# Patient Record
Sex: Female | Born: 1975
Health system: Southern US, Community
[De-identification: ages and names within clinical notes are randomized; demographics above are authoritative.]

## PROBLEM LIST (undated history)

## (undated) DIAGNOSIS — F419 Anxiety disorder, unspecified: Secondary | ICD-10-CM

## (undated) DIAGNOSIS — G35 Multiple sclerosis: Secondary | ICD-10-CM

## (undated) DIAGNOSIS — H269 Unspecified cataract: Secondary | ICD-10-CM

## (undated) HISTORY — DX: Unspecified cataract: H26.9

## (undated) HISTORY — PX: ABDOMINAL HYSTERECTOMY: SHX81

## (undated) HISTORY — DX: Multiple sclerosis: G35

## (undated) HISTORY — PX: CHOLECYSTECTOMY: SHX55

## (undated) HISTORY — PX: EYE SURGERY: SHX253

## (undated) HISTORY — DX: Anxiety disorder, unspecified: F41.9

---

## 2012-01-20 ENCOUNTER — Ambulatory Visit (INDEPENDENT_AMBULATORY_CARE_PROVIDER_SITE_OTHER): Payer: BC Managed Care – PPO

## 2012-01-20 ENCOUNTER — Encounter: Payer: Self-pay | Admitting: Sports Medicine

## 2012-01-20 ENCOUNTER — Ambulatory Visit (INDEPENDENT_AMBULATORY_CARE_PROVIDER_SITE_OTHER): Payer: BC Managed Care – PPO | Admitting: Sports Medicine

## 2012-01-20 VITALS — BP 114/74 | HR 72 | Wt 173.0 lb

## 2012-01-20 DIAGNOSIS — M47816 Spondylosis without myelopathy or radiculopathy, lumbar region: Secondary | ICD-10-CM | POA: Insufficient documentation

## 2012-01-20 DIAGNOSIS — M5416 Radiculopathy, lumbar region: Secondary | ICD-10-CM

## 2012-01-20 DIAGNOSIS — M545 Low back pain, unspecified: Secondary | ICD-10-CM

## 2012-01-20 DIAGNOSIS — N3946 Mixed incontinence: Secondary | ICD-10-CM | POA: Insufficient documentation

## 2012-01-20 DIAGNOSIS — IMO0002 Reserved for concepts with insufficient information to code with codable children: Secondary | ICD-10-CM

## 2012-01-20 MED ORDER — PREDNISONE 50 MG PO TABS: ORAL_TABLET | ORAL | Status: DC

## 2012-01-20 MED ORDER — MELOXICAM 15 MG PO TABS: ORAL_TABLET | ORAL | Status: DC

## 2012-01-20 NOTE — Progress Notes (Signed)
Subjective:    CC: Establish care.   HPI:  Low back pain: Present for several weeks, worse with flexion, associated with pain running down left buttock, left lateral thigh, lateral lower leg, and lateral foot causing numbness and tingling in the toes. Does have a history of multiple sclerosis, but also has a strong family history of disc protrusions. Denies any new bowel or bladder incontinence. No saddle anesthesia. No constitutional symptoms.   Incontinence: Present for many years, was present before her hysterectomy which was done for endometrial dysplasia. Occurs with jumps, Valsalva, coughing, sneezing. Denies any sudden urges to use the bathroom, and denies any imperceptible dribbling. Was placed on oxybutynin by her neurologist, but has  noted no benefit.  She did have frequent UTIs earlier on with her multiple sclerosis, but has been a longtime since this happened.  No dysuria, flank pain, burning, or frequency today.  Multiple sclerosis: Followed by her neurologist, is under good control.  Past medical history, Surgical history, Family history, Social history, Allergies, and medications have been entered into the medical record, reviewed, and no changes needed.   Review of Systems: No headache, visual changes, nausea, vomiting, diarrhea, constipation, dizziness, abdominal pain, skin rash, fevers, chills, night sweats, swollen lymph nodes, weight loss, chest pain, body aches, joint swelling, muscle aches, or shortness of breath.   Objective:    General: Well Developed, well nourished, and in no acute distress.  Neuro: Alert and oriented x3, extra-ocular muscles intact.  HEENT: Normocephalic, atraumatic, pupils equal round reactive to light, neck supple, no masses, no lymphadenopathy, thyroid nonpalpable.  Skin: Warm and dry, no rashes noted.  Cardiac: Regular rate and rhythm, no murmurs rubs or gallops.  Respiratory: Clear to auscultation bilaterally. Not using accessory muscles,  speaking in full sentences.  Abdominal: Soft, nontender, nondistended, positive bowel sounds, no masses, no organomegaly.  Back Exam:  Inspection: Unremarkable  Motion: Flexion 45 deg, Extension 45 deg, Side Bending to 45 deg bilaterally,  Rotation to 45 deg bilaterally  SLR laying: Negative  XSLR laying: Negative  Palpable tenderness: None. FABER: negative. Sensory change: Gross sensation intact to all lumbar and sacral dermatomes.  Reflexes: 2+ at both patellar tendons, 2+ at achilles tendons, Babinski's downgoing.  Strength at foot  Plantar-flexion: 5/5 Dorsi-flexion: 5/5 Eversion: 5/5 Inversion: 5/5  Leg strength  Quad: 5/5 Hamstring: 5/5 Hip flexor: 5/5 Hip abductors: 5/5  Gait unremarkable.     Impression and Recommendations:  I spent one hour with this patient, 50% was face-to-face counseling.

## 2012-01-20 NOTE — Patient Instructions (Addendum)
Hip Rehabilitation Protocol:  1.  Side leg raises.  3x30 with no weight, then 3x15 with 2 lb ankle weight, then 3x15 with 5 lb ankle weight 

## 2012-01-20 NOTE — Assessment & Plan Note (Signed)
Suspect left-sided S1, discogenic. Home rehabilitation, prednisone, Mobic, x-rays, return to clinic 4 weeks to reassess. MRI if no better for interventional injection planning

## 2012-01-20 NOTE — Assessment & Plan Note (Addendum)
Suspect predominantly stress based on symptoms. I do not think that oxybutynin is good for her symptoms, I have asked her to stop this. Would like her to work on [employer],  I would also like her to get plugged into urology for urodynamic studies, and assistance with treatment.

## 2012-01-21 ENCOUNTER — Telehealth: Payer: Self-pay | Admitting: *Deleted

## 2012-01-21 NOTE — Telephone Encounter (Signed)
Pt called asking about her xray results. I have called her to let her know that they were negative/normal.

## 2012-07-07 ENCOUNTER — Encounter: Payer: Self-pay | Admitting: Sports Medicine

## 2012-07-24 ENCOUNTER — Encounter: Payer: Self-pay | Admitting: Sports Medicine

## 2012-10-09 ENCOUNTER — Encounter: Payer: Self-pay | Admitting: Sports Medicine

## 2012-10-09 ENCOUNTER — Ambulatory Visit (INDEPENDENT_AMBULATORY_CARE_PROVIDER_SITE_OTHER): Payer: BC Managed Care – PPO | Admitting: Sports Medicine

## 2012-10-09 ENCOUNTER — Ambulatory Visit (INDEPENDENT_AMBULATORY_CARE_PROVIDER_SITE_OTHER): Payer: BC Managed Care – PPO

## 2012-10-09 VITALS — BP 106/65 | HR 71 | Wt 182.0 lb

## 2012-10-09 DIAGNOSIS — M76899 Other specified enthesopathies of unspecified lower limb, excluding foot: Secondary | ICD-10-CM

## 2012-10-09 DIAGNOSIS — M5412 Radiculopathy, cervical region: Secondary | ICD-10-CM | POA: Insufficient documentation

## 2012-10-09 DIAGNOSIS — N3946 Mixed incontinence: Secondary | ICD-10-CM

## 2012-10-09 DIAGNOSIS — G35 Multiple sclerosis: Secondary | ICD-10-CM

## 2012-10-09 DIAGNOSIS — G35D Multiple sclerosis, unspecified: Secondary | ICD-10-CM

## 2012-10-09 DIAGNOSIS — M5030 Other cervical disc degeneration, unspecified cervical region: Secondary | ICD-10-CM

## 2012-10-09 DIAGNOSIS — M5416 Radiculopathy, lumbar region: Secondary | ICD-10-CM

## 2012-10-09 DIAGNOSIS — M7062 Trochanteric bursitis, left hip: Secondary | ICD-10-CM | POA: Insufficient documentation

## 2012-10-09 DIAGNOSIS — M7061 Trochanteric bursitis, right hip: Secondary | ICD-10-CM | POA: Insufficient documentation

## 2012-10-09 IMAGING — CR DG CERVICAL SPINE COMPLETE 4+V
5 series · 5 of 5 positions shown · non-contrast
Comparison: None.

CLINICAL DATA: Left C7 radiculopathy.

CERVICAL SPINE - COMPLETE 4+ VIEW

[view not recorded (1 of 5)]
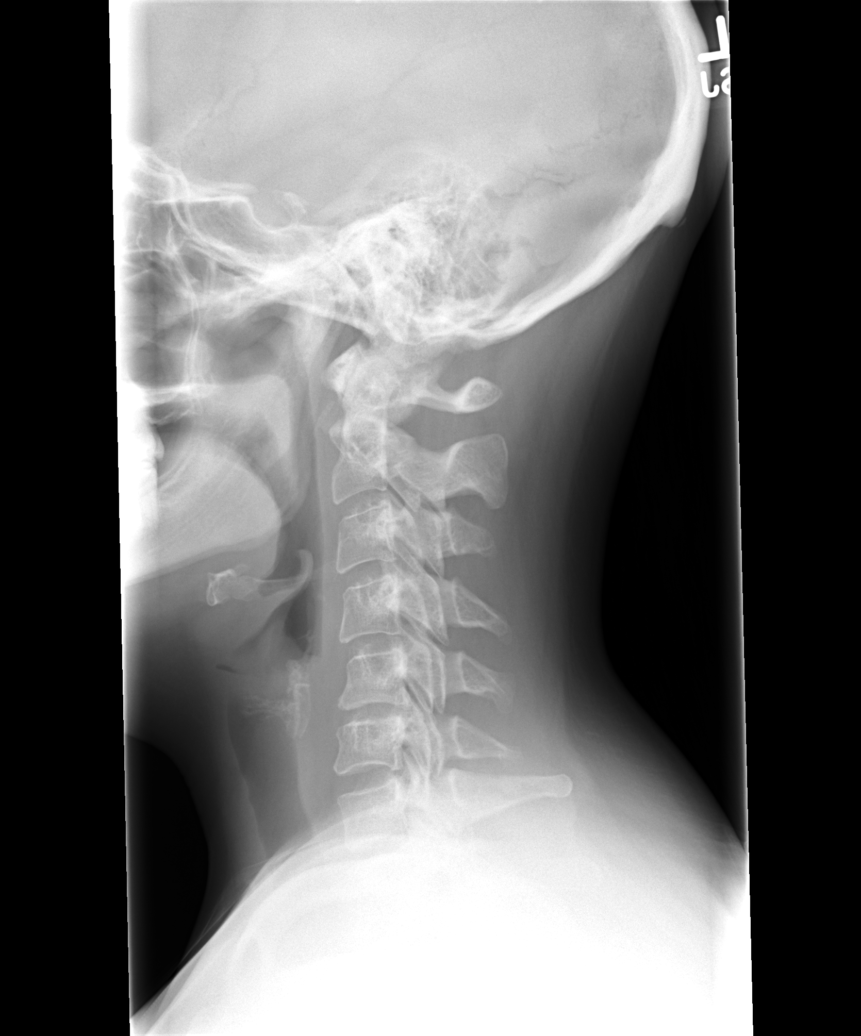

[view not recorded (2 of 5)]
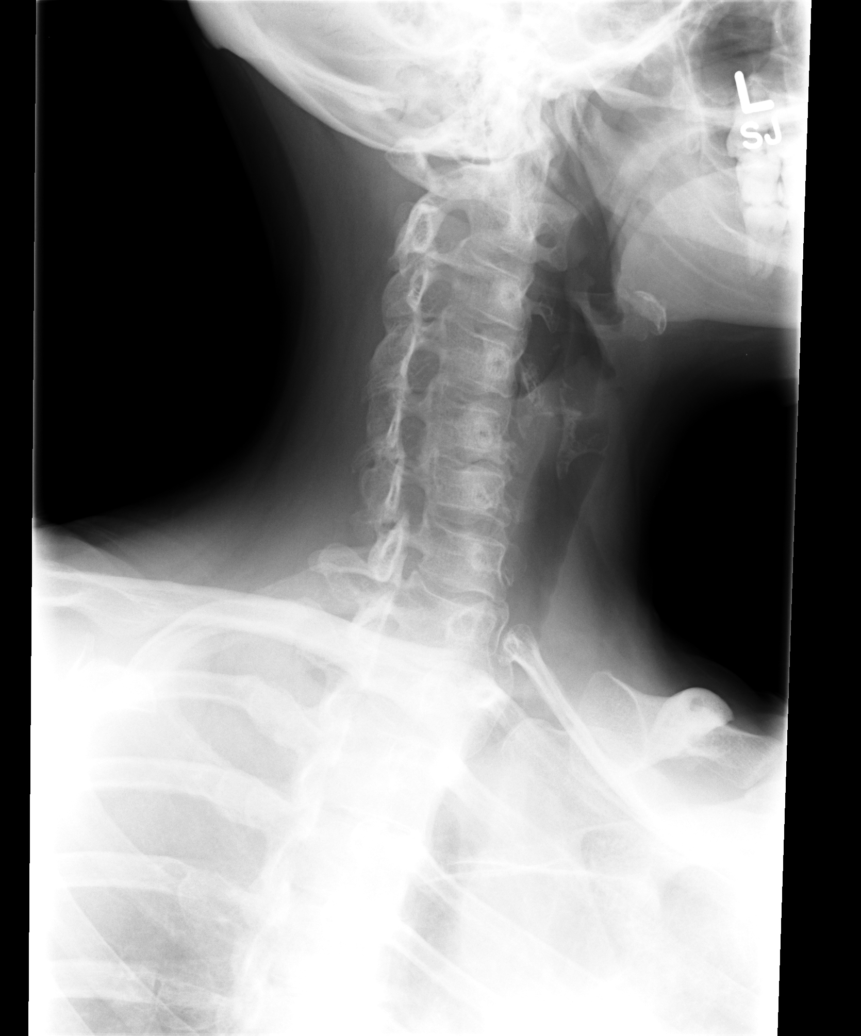

[view not recorded (3 of 5)]
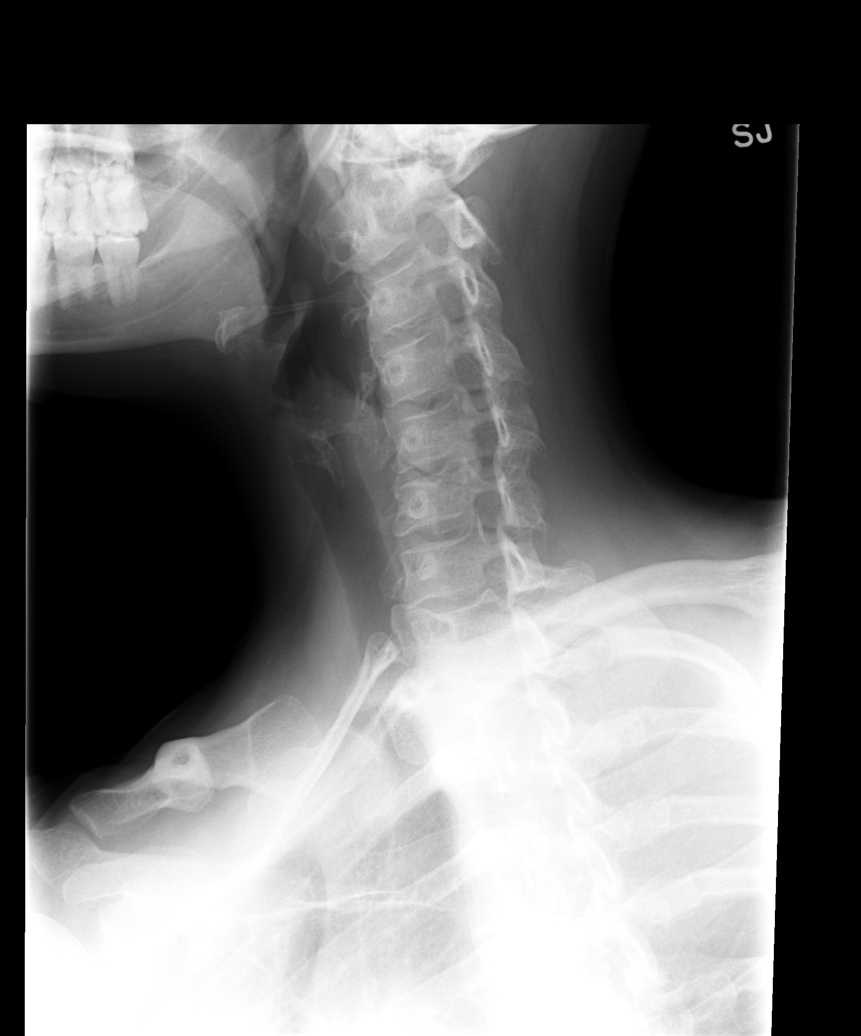

[view not recorded (4 of 5)]
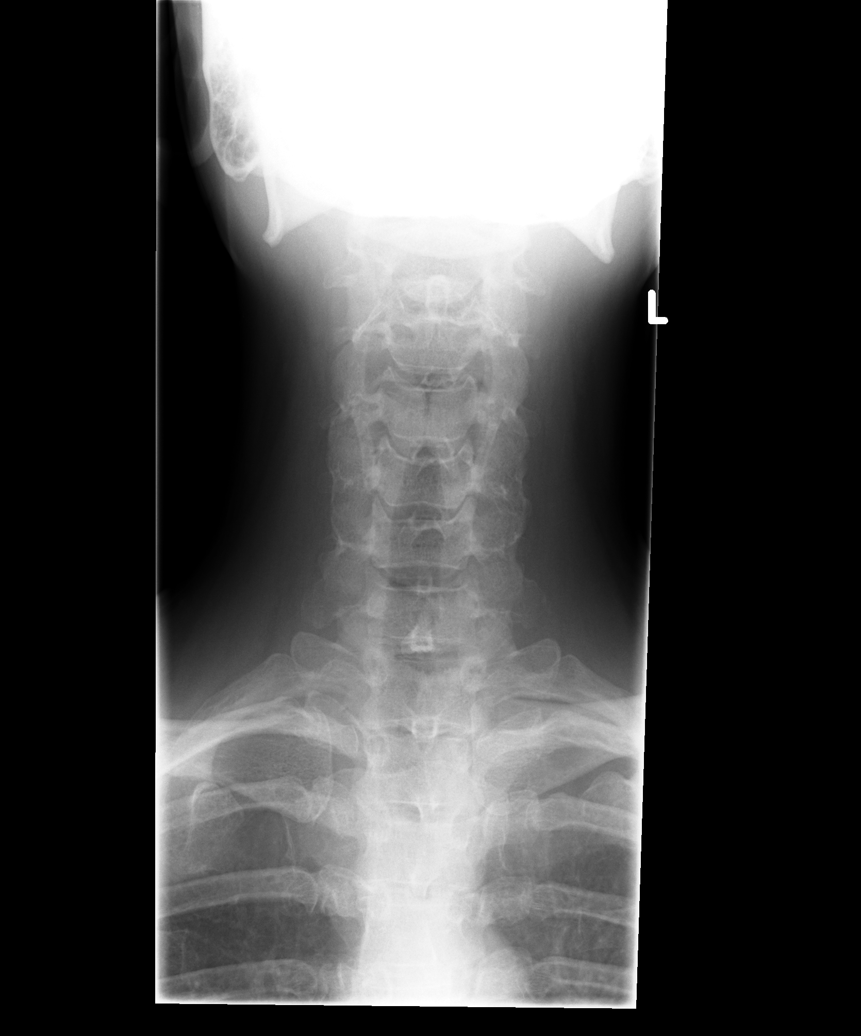

[view not recorded (5 of 5)]
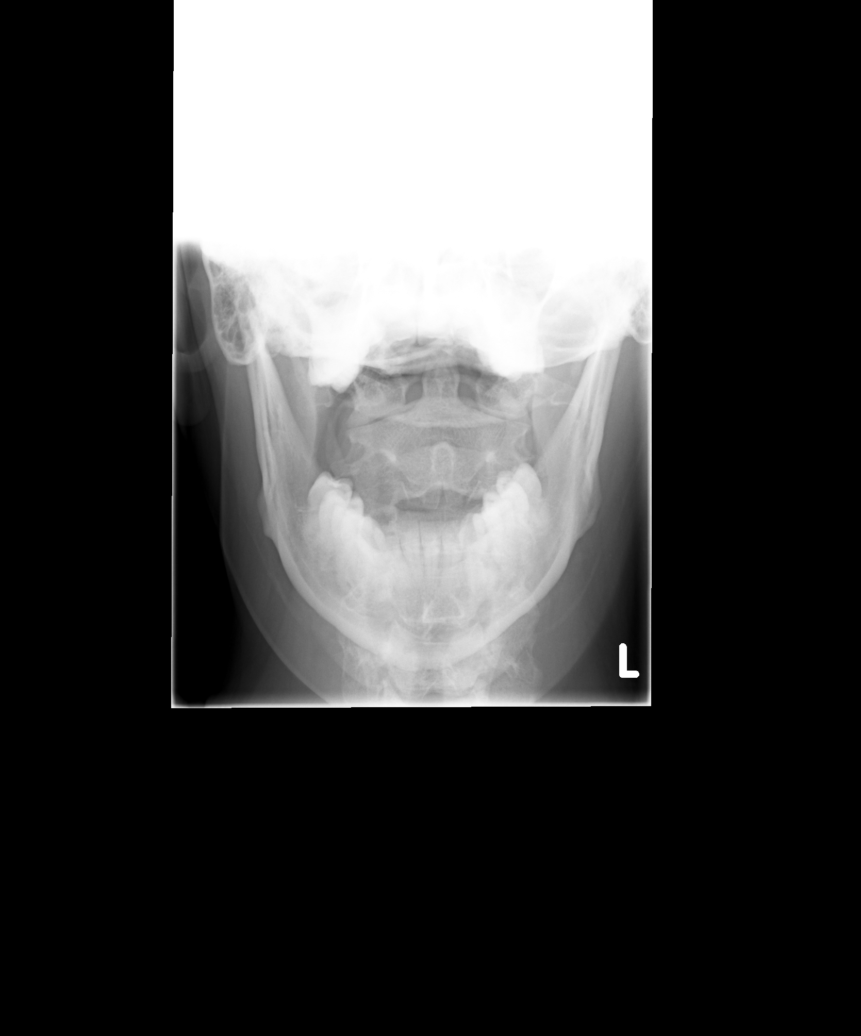

[5 of 5 positions shown; findings below may reference images not displayed]

FINDINGS: The lateral film demonstrates mild straightening of the
normal cervical lordosis.  The alignment is normal.  Disc spaces
are fairly well maintained.  Minimal degenerative disc disease
noted at C5-6.  The facets are normally aligned.  The neural
foramen are patent.  Small cervical ribs are noted.  The C1-2
articulations are maintained.  The lung apices are clear.
IMPRESSION: 1.  Mild straightening of the normal cervical lordosis.
2.  Minimal degenerative disc disease at C5-6.
3.  Normal alignment and no acute bony findings.

## 2012-10-09 MED ORDER — BACLOFEN 10 MG PO TABS: 10.0000 mg | ORAL_TABLET | Freq: Two times a day (BID) | ORAL | Status: DC

## 2012-10-09 MED ORDER — MELOXICAM 15 MG PO TABS: ORAL_TABLET | ORAL | Status: DC

## 2012-10-09 NOTE — Assessment & Plan Note (Signed)
Mixed stress and urge incontinence. Continue baclofen.

## 2012-10-09 NOTE — Assessment & Plan Note (Signed)
Resolved with conservative measures. 

## 2012-10-09 NOTE — Assessment & Plan Note (Signed)
Continue conservative measures, injection if no better in 4 weeks.

## 2012-10-09 NOTE — Progress Notes (Signed)
  Subjective:    CC: Followup  HPI: Lumbar radiculitis: Resolved with conservative measures.  Neck pain: Radiates to the left upper arm, and down in the C7 distribution. Worse with looking to the left, pain is moderate, persistent.  Bilateral hip pain: Pain is localized over both trochanteric bursae, doesn't radiate, moderate, unable to lie on her sides due to pain. She does continue to do hip abductor exercises.  Past medical history, Surgical history, Family history not pertinant except as noted below, Social history, Allergies, and medications have been entered into the medical record, reviewed, and no changes needed.   Review of Systems: No fevers, chills, night sweats, weight loss, chest pain, or shortness of breath.   Objective:    General: Well Developed, well nourished, and in no acute distress.  Neuro: Alert and oriented x3, extra-ocular muscles intact, sensation grossly intact.  HEENT: Normocephalic, atraumatic, pupils equal round reactive to light, neck supple, no masses, no lymphadenopathy, thyroid nonpalpable.  Skin: Warm and dry, no rashes. Cardiac: Regular rate and rhythm, no murmurs rubs or gallops, no lower extremity edema.  Respiratory: Clear to auscultation bilaterally. Not using accessory muscles, speaking in full sentences. Neck: Inspection unremarkable. No palpable stepoffs. Negative Spurling's maneuver. Full neck range of motion Grip strength and sensation normal in bilateral hands Strength good C4 to T1 distribution No sensory change to C4 to T1 Negative Hoffman sign bilaterally Reflexes normal Bilateral Hip: ROM IR: 45 Deg, ER: 45 Deg, Flexion: 120 Deg, Extension: 100 Deg, Abduction: 45 Deg, Adduction: 45 Deg Strength IR: 5/5, ER: 5/5, Flexion: 5/5, Extension: 5/5, Abduction: 5/5, Adduction: 5/5 Pelvic alignment unremarkable to inspection and palpation. Standing hip rotation and gait without trendelenburg sign / unsteadiness. Greater trochanter with  tenderness to palpation. No tenderness over piriformis and greater trochanter. No pain with FABER or FADIR. No SI joint tenderness and normal minimal SI movement. Hip abductor strength is much better.  X-rays reviewed and show bilateral C5-C6 degenerative disc disease that is mild.  Impression and Recommendations:

## 2012-10-09 NOTE — Assessment & Plan Note (Signed)
Seems to be left C7. Home exercises, Mobic. Baclofen. X-rays. Return in 4 weeks.

## 2012-11-23 ENCOUNTER — Encounter: Payer: Self-pay | Admitting: Sports Medicine

## 2012-11-23 ENCOUNTER — Ambulatory Visit (INDEPENDENT_AMBULATORY_CARE_PROVIDER_SITE_OTHER): Payer: BC Managed Care – PPO | Admitting: Sports Medicine

## 2012-11-23 VITALS — BP 98/66 | HR 79 | Wt 184.0 lb

## 2012-11-23 DIAGNOSIS — M5412 Radiculopathy, cervical region: Secondary | ICD-10-CM

## 2012-11-23 NOTE — Progress Notes (Signed)
  Subjective:    CC: Followup  HPI: Left cervical radiculitis: Julie Ibarra returns, she had pain in her neck radiating down her left arm in the C7 distribution. X-rays at that time showed C5-C6 degenerative disc disease that was mild. I placed her through home exercises, baclofen, NSAIDs. She could not use steroids. She returns today with pain approximately the same. It is worse when turning her head to the left, radiates down the left arm in the C7 distribution, is mild to moderate.  Past medical history, Surgical history, Family history not pertinant except as noted below, Social history, Allergies, and medications have been entered into the medical record, reviewed, and no changes needed.   Review of Systems: No fevers, chills, night sweats, weight loss, chest pain, or shortness of breath.   Objective:    General: Well Developed, well nourished, and in no acute distress.  Neuro: Alert and oriented x3, extra-ocular muscles intact, sensation grossly intact.  HEENT: Normocephalic, atraumatic, pupils equal round reactive to light, neck supple, no masses, no lymphadenopathy, thyroid nonpalpable.  Skin: Warm and dry, no rashes. Cardiac: Regular rate and rhythm, no murmurs rubs or gallops, no lower extremity edema.  Respiratory: Clear to auscultation bilaterally. Not using accessory muscles, speaking in full sentences. Neck: Inspection unremarkable. No palpable stepoffs. Negative Spurling's maneuver. Full neck range of motion Grip strength and sensation normal in bilateral hands Strength good C4 to T1 distribution No sensory change to C4 to T1 Negative Hoffman sign bilaterally Reflexes normal  Impression and Recommendations:

## 2012-11-23 NOTE — Assessment & Plan Note (Signed)
Persistent left-sided C7 radiculopathy despite home exercises and NSAIDs, she is declining steroids. At this point we are going to now proceed with formal physical therapy, she does have an x-ray showing C5-C6 degenerative disc disease. 6 with some formal physical therapy, she'll come back to see me and we can proceed with MRI for interventional injection planning if no better.

## 2013-03-19 ENCOUNTER — Encounter: Payer: Self-pay | Admitting: Sports Medicine

## 2013-03-19 ENCOUNTER — Ambulatory Visit (INDEPENDENT_AMBULATORY_CARE_PROVIDER_SITE_OTHER): Payer: BC Managed Care – PPO | Admitting: Sports Medicine

## 2013-03-19 VITALS — BP 102/66 | HR 81 | Ht 68.0 in | Wt 186.0 lb

## 2013-03-19 DIAGNOSIS — M5412 Radiculopathy, cervical region: Secondary | ICD-10-CM

## 2013-03-19 DIAGNOSIS — E663 Overweight: Secondary | ICD-10-CM

## 2013-03-19 LAB — CBC
HCT: 38.6 % (ref 36.0–46.0)
Hemoglobin: 13.6 g/dL (ref 12.0–15.0)
MCH: 32.4 pg (ref 26.0–34.0)
MCHC: 35.2 g/dL (ref 30.0–36.0)
MCV: 91.9 fL (ref 78.0–100.0)
Platelets: 252 K/uL (ref 150–400)
RBC: 4.2 MIL/uL (ref 3.87–5.11)
RDW: 13.4 % (ref 11.5–15.5)
WBC: 4.6 K/uL (ref 4.0–10.5)

## 2013-03-19 LAB — COMPREHENSIVE METABOLIC PANEL
BUN: 17 mg/dL (ref 6–23)
CO2: 27 mEq/L (ref 19–32)
Glucose, Bld: 84 mg/dL (ref 70–99)
Sodium: 142 mEq/L (ref 135–145)
Total Bilirubin: 0.5 mg/dL (ref 0.3–1.2)
Total Protein: 6.5 g/dL (ref 6.0–8.3)

## 2013-03-19 LAB — LIPID PANEL
Cholesterol: 135 mg/dL (ref 0–200)
HDL: 59 mg/dL (ref >39)
LDL Cholesterol: 63 mg/dL (ref 0–99)
Total CHOL/HDL Ratio: 2.3 ratio
Triglycerides: 66 mg/dL (ref <150)
VLDL: 13 mg/dL (ref 0–40)

## 2013-03-19 LAB — COMPREHENSIVE METABOLIC PANEL WITH GFR
ALT: 22 U/L (ref 0–35)
AST: 20 U/L (ref 0–37)
Albumin: 4.4 g/dL (ref 3.5–5.2)
Alkaline Phosphatase: 66 U/L (ref 39–117)
Calcium: 9.7 mg/dL (ref 8.4–10.5)
Chloride: 104 meq/L (ref 96–112)
Creat: 0.7 mg/dL (ref 0.50–1.10)
Potassium: 4.8 meq/L (ref 3.5–5.3)

## 2013-03-19 LAB — HEMOGLOBIN A1C
Hgb A1c MFr Bld: 5.1 % (ref <5.7)
Mean Plasma Glucose: 100 mg/dL (ref <117)

## 2013-03-19 LAB — TSH: TSH: 1.926 u[IU]/mL (ref 0.350–4.500)

## 2013-03-19 MED ORDER — PHENTERMINE HCL 37.5 MG PO CAPS: 37.5000 mg | ORAL_CAPSULE | ORAL | Status: DC

## 2013-03-19 NOTE — Progress Notes (Signed)
  Subjective:    CC: Weight loss  HPI: Julie Ibarra is a very pleasant 37 year old female, she is overweight, and she does have a history of lumbar and cervical degenerative disc disease as well as multiple sclerosis. She has tried multiple means of dieting, but is unable to lose weight. She does exercise, but at approximately 50% of her max heart rate for only about 20 minutes. She desires further advice and possibly pharmacologic intervention for weight loss.  Past medical history, Surgical history, Family history not pertinant except as noted below, Social history, Allergies, and medications have been entered into the medical record, reviewed, and no changes needed.   Review of Systems: No fevers, chills, night sweats, weight loss, chest pain, or shortness of breath.   Objective:    General: Well Developed, well nourished, and in no acute distress.  Neuro: Alert and oriented x3, extra-ocular muscles intact, sensation grossly intact.  HEENT: Normocephalic, atraumatic, pupils equal round reactive to light, neck supple, no masses, no lymphadenopathy, thyroid nonpalpable.  Skin: Warm and dry, no rashes. Cardiac: Regular rate and rhythm, no murmurs rubs or gallops, no lower extremity edema.  Respiratory: Clear to auscultation bilaterally. Not using accessory muscles, speaking in full sentences.  Impression and Recommendations:   I spent 25 minutes with this patient, greater than 50% was face-to-face time counseling regarding the above diagnosis.

## 2013-03-19 NOTE — Patient Instructions (Signed)
Exercise prescription:  You should adjust the intensity of your exercise based on your heart rate. The [employer] sports medicine recommends keeping your heart rate between 70-80% of its maximum for 30 minutes, 3-5 times per week. Maximum heart rate = (220 - age). Multiply this number by 0.75 to get your goal heart rate. If lower, then increase the intensity of your exercise. If the number is higher, you may decrease the intensity of your exercise. Your goal heart rate is 145.  Also check glycemic indices at www.[facility].com

## 2013-03-19 NOTE — Assessment & Plan Note (Signed)
Discussed dieting, exercise prescription, we are going to start phentermine. We can use overweight with complications of degenerative disc disease, but she may have to still pay for this out of pocket.

## 2013-03-19 NOTE — Assessment & Plan Note (Signed)
Significantly improved with physical therapy. Return as needed for this.

## 2013-08-07 ENCOUNTER — Other Ambulatory Visit: Payer: Self-pay | Admitting: Sports Medicine

## 2013-12-03 ENCOUNTER — Emergency Department
Admission: EM | Admit: 2013-12-03 | Discharge: 2013-12-03 | Disposition: A | Discharge status: Home / Self Care | Payer: BC Managed Care – PPO | Source: Home / Self Care | Attending: Family Medicine | Admitting: Family Medicine

## 2013-12-03 ENCOUNTER — Encounter: Payer: Self-pay | Admitting: Emergency Medicine

## 2013-12-03 DIAGNOSIS — N3000 Acute cystitis without hematuria: Secondary | ICD-10-CM

## 2013-12-03 LAB — POCT URINALYSIS DIP (MANUAL ENTRY)
BILIRUBIN UA: NEGATIVE
Bilirubin, UA: NEGATIVE
Blood, UA: NEGATIVE
GLUCOSE UA: NEGATIVE
Nitrite, UA: NEGATIVE
Protein Ur, POC: NEGATIVE
Spec Grav, UA: 1.01 (ref 1.005–1.03)
Urobilinogen, UA: 0.2 (ref 0–1)
pH, UA: 7 (ref 5–8)

## 2013-12-03 MED ORDER — NITROFURANTOIN MONOHYD MACRO 100 MG PO CAPS: 100.0000 mg | ORAL_CAPSULE | Freq: Two times a day (BID) | ORAL | Status: DC

## 2013-12-03 NOTE — ED Provider Notes (Signed)
CSN: [id number]     Arrival date & time 12/03/13  4098 History   First MD Initiated Contact with Patient 12/03/13 1006     Chief Complaint  Patient presents with  . Dysuria  . Urinary Frequency      HPI Comments: Patient complains of two day history of urgency, dysuria, occasional incontinence, and hesitancy.  Patient has had a hysterectomy.   Patient is a 38 y.o. female presenting with dysuria. The history is provided by the patient.  Dysuria Pain quality:  Burning Pain severity:  Mild Onset quality:  Gradual Duration:  2 days Timing:  Constant Progression:  Worsening Chronicity:  New Recent urinary tract infections: no   Relieved by:  Phenazopyridine Worsened by:  Nothing tried Ineffective treatments:  None tried Urinary symptoms: frequent urination, hesitancy and incontinence   Urinary symptoms: no discolored urine, no foul-smelling urine and no hematuria   Associated symptoms: no abdominal pain, no fever, no flank pain, no genital lesions, no nausea, no vaginal discharge and no vomiting   Risk factors: recurrent urinary tract infections     Past Medical History  Diagnosis Date  . Multiple sclerosis    Past Surgical History  Procedure Laterality Date  . Cholecystectomy    . Abdominal hysterectomy     Family History  Problem Relation Age of Onset  . Alcohol abuse Mother   . Hypertension Mother   . COPD Mother   . Alcohol abuse Father   . Heart disease Father   . Hyperlipidemia Father   . Hypertension Father    History  Substance Use Topics  . Smoking status: Former Games developer  . Smokeless tobacco: Not on file  . Alcohol Use: Yes   OB History   Grav Para Term Preterm Abortions TAB SAB Ect Mult Living                 Review of Systems  Constitutional: Negative for fever.  Gastrointestinal: Negative for nausea, vomiting and abdominal pain.  Genitourinary: Positive for dysuria. Negative for flank pain and vaginal discharge.  All other systems reviewed and are  negative.   Allergies  Review of patient's allergies indicates no known allergies.  Home Medications   Prior to Admission medications   Medication Sig Start Date End Date Taking? Authorizing Provider  baclofen (LIORESAL) 10 MG tablet Take 1 tablet (10 mg total) by mouth 2 (two) times daily. 10/09/12   Monica Becton, MD  escitalopram (LEXAPRO) 10 MG tablet Take 10 mg by mouth daily.    Historical Provider, MD  Fingolimod HCl 0.5 MG CAPS Take 0.5 mg by mouth daily.    Historical Provider, MD  meloxicam (MOBIC) 15 MG tablet TAKE ONE TABLET BY MOUTH EVERY MORNING WITH BREAKFAST FOR 2 WEEKS. THEN TAKE ONCE DAILY AS NEEDED FOR PAIN.    Monica Becton, MD  nitrofurantoin, macrocrystal-monohydrate, (MACROBID) 100 MG capsule Take 1 capsule (100 mg total) by mouth 2 (two) times daily. Take with food. 12/03/13   Lattie Haw, MD  phentermine 37.5 MG capsule Take 1 capsule (37.5 mg total) by mouth every morning. 03/19/13   Monica Becton, MD   BP 104/53  Pulse 76  Temp(Src) 98.6 F (37 C) (Oral)  Resp 16  Ht  (1.727 m)  Wt 190 lb (86.183 kg)  BMI 28.90 kg/m2  SpO2 96% Physical Exam Nursing notes and Vital Signs reviewed. Appearance:  Patient appears healthy, stated age, and in no acute distress Eyes:  Pupils are equal,  round, and reactive to light and accomodation.  Extraocular movement is intact.  Conjunctivae are not inflamed .  Pharynx:  Normal; moist mucous membranes  Neck:  Supple.  No adenopathy Lungs:  Clear to auscultation.  Breath sounds are equal.  Heart:  Regular rate and rhythm without murmurs, rubs, or gallops.  Abdomen:  Nontender without masses or hepatosplenomegaly.  Bowel sounds are present.  No CVA or flank tenderness.  Extremities:  No edema.  No calf tenderness Skin:  No rash present.   ED Course  Procedures  None    Labs Reviewed  URINE CULTURE  POCT URINALYSIS DIP (MANUAL ENTRY):  LEU small, otherwise negative      MDM   1. Acute  cystitis without hematuria    Urine culture pending. Begin Macrobid. Continue increased fluid intake.  May take AZO for two more days. Followup with Family Doctor if not improved in one week.     Lattie Haw, MD 12/03/13 1120

## 2013-12-03 NOTE — ED Notes (Signed)
Reports dysuria and frequency x 2 days; has history of UTIs. Used AZO last night.

## 2013-12-03 NOTE — Discharge Instructions (Signed)
Continue increased fluid intake.  May take AZO for two more days.   Urinary Tract Infection Urinary tract infections (UTIs) can develop anywhere along your urinary tract. Your urinary tract is your body's drainage system for removing wastes and extra water. Your urinary tract includes two kidneys, two ureters, a bladder, and a urethra. Your kidneys are a pair of bean-shaped organs. Each kidney is about the size of your fist. They are located below your ribs, one on each side of your spine. CAUSES Infections are caused by microbes, which are microscopic organisms, including fungi, viruses, and bacteria. These organisms are so small that they can only be seen through a microscope. Bacteria are the microbes that most commonly cause UTIs. SYMPTOMS  Symptoms of UTIs may vary by age and gender of the patient and by the location of the infection. Symptoms in young women typically include a frequent and intense urge to urinate and a painful, burning feeling in the bladder or urethra during urination. Older women and men are more likely to be tired, shaky, and weak and have muscle aches and abdominal pain. A fever may mean the infection is in your kidneys. Other symptoms of a kidney infection include pain in your back or sides below the ribs, nausea, and vomiting. DIAGNOSIS To diagnose a UTI, your caregiver will ask you about your symptoms. Your caregiver also will ask to provide a urine sample. The urine sample will be tested for bacteria and white blood cells. White blood cells are made by your body to help fight infection. TREATMENT  Typically, UTIs can be treated with medication. Because most UTIs are caused by a bacterial infection, they usually can be treated with the use of antibiotics. The choice of antibiotic and length of treatment depend on your symptoms and the type of bacteria causing your infection. HOME CARE INSTRUCTIONS  If you were prescribed antibiotics, take them exactly as your caregiver  instructs you. Finish the medication even if you feel better after you have only taken some of the medication.  Drink enough water and fluids to keep your urine clear or pale yellow.  Avoid caffeine, tea, and carbonated beverages. They tend to irritate your bladder.  Empty your bladder often. Avoid holding urine for long periods of time.  Empty your bladder before and after sexual intercourse.  After a bowel movement, women should cleanse from front to back. Use each tissue only once. SEEK MEDICAL CARE IF:   You have back pain.  You develop a fever.  Your symptoms do not begin to resolve within 3 days. SEEK IMMEDIATE MEDICAL CARE IF:   You have severe back pain or lower abdominal pain.  You develop chills.  You have nausea or vomiting.  You have continued burning or discomfort with urination. MAKE SURE YOU:   Understand these instructions.  Will watch your condition.  Will get help right away if you are not doing well or get worse. Document Released: 12/23/2004 Document Revised: 09/14/2011 Document Reviewed: 04/23/2011 [facility] Patient Information 2015 [place], [place]. This information is not intended to replace advice given to you by your health care provider. Make sure you discuss any questions you have with your health care provider.

## 2013-12-07 ENCOUNTER — Telehealth: Payer: Self-pay | Admitting: Emergency Medicine

## 2013-12-07 LAB — URINE CULTURE

## 2013-12-07 NOTE — ED Notes (Signed)
Inquired about patient's status; encourage them to call with questions/concerns.  

## 2014-03-06 ENCOUNTER — Telehealth: Payer: Self-pay

## 2014-03-06 NOTE — Telephone Encounter (Signed)
[facility] Please advise that patient last OV was 03/19/2013. Rhonda Cunningham,CMA

## 2014-03-07 MED ORDER — BACLOFEN 10 MG PO TABS: 10.0000 mg | ORAL_TABLET | Freq: Two times a day (BID) | ORAL | Status: DC

## 2014-03-07 NOTE — Telephone Encounter (Signed)
Patient informed. Rhonda Cunningham,CMA  

## 2014-03-07 NOTE — Telephone Encounter (Signed)
refilled 

## 2014-03-25 ENCOUNTER — Ambulatory Visit (INDEPENDENT_AMBULATORY_CARE_PROVIDER_SITE_OTHER): Payer: BC Managed Care – PPO | Admitting: Sports Medicine

## 2014-03-25 ENCOUNTER — Encounter: Payer: Self-pay | Admitting: Sports Medicine

## 2014-03-25 VITALS — BP 101/66 | HR 65 | Ht 68.0 in | Wt 186.0 lb

## 2014-03-25 DIAGNOSIS — N3946 Mixed incontinence: Secondary | ICD-10-CM

## 2014-03-25 DIAGNOSIS — M2400 Loose body in unspecified joint: Secondary | ICD-10-CM

## 2014-03-25 DIAGNOSIS — L821 Other seborrheic keratosis: Secondary | ICD-10-CM

## 2014-03-25 MED ORDER — BACLOFEN 10 MG PO TABS: 10.0000 mg | ORAL_TABLET | Freq: Two times a day (BID) | ORAL | Status: DC

## 2014-03-25 NOTE — Assessment & Plan Note (Signed)
Approximately 6 mm across. It does appear to be sitting in the superficial infrapatellar bursa, and medially but outside the joint. This is not symptomatic, we can ignore this.

## 2014-03-25 NOTE — Assessment & Plan Note (Signed)
Benign, return as needed. Patient declines cryotherapy.

## 2014-03-25 NOTE — Progress Notes (Signed)
  Subjective:    CC: Couple of complaints  HPI: Spots on face: Right-sided, there is a single lesion that has been present for some time now, it is asymptomatic.  Mass in knee: Patient has reported a very small mass under the skin in her anterior knee that is movable but not painful. She tells me her entire knee is overall asymptomatic.  Past medical history, Surgical history, Family history not pertinant except as noted below, Social history, Allergies, and medications have been entered into the medical record, reviewed, and no changes needed.   Review of Systems: No fevers, chills, night sweats, weight loss, chest pain, or shortness of breath.   Objective:    General: Well Developed, well nourished, and in no acute distress.  Neuro: Alert and oriented x3, extra-ocular muscles intact, sensation grossly intact.  HEENT: Normocephalic, atraumatic, pupils equal round reactive to light, neck supple, no masses, no lymphadenopathy, thyroid nonpalpable.  Skin: Warm and dry, no rashes. Seborrheic keratosis on the right scalp. Cardiac: Regular rate and rhythm, no murmurs rubs or gallops, no lower extremity edema.  Respiratory: Clear to auscultation bilaterally. Not using accessory muscles, speaking in full sentences. Left Knee: Normal to inspection with no erythema or effusion or obvious bony abnormalities. There is a subcentimeter palpable, well-defined, movable mass below the skin that can be moved both over the distal patellar tendon and medial to it. Palpation normal with no warmth or joint line tenderness or patellar tenderness or condyle tenderness. ROM normal in flexion and extension and lower leg rotation. Ligaments with solid consistent endpoints including ACL, PCL, LCL, MCL. Negative Mcmurray's and provocative meniscal tests. Non painful patellar compression. Patellar and quadriceps tendons unremarkable. Hamstring and quadriceps strength is normal.  Procedure: Diagnostic Ultrasound  of left knee  Device: GE Logiq E  Findings: 6 mm hypoechoic structure that is movable. Images permanently stored and available for review in the ultrasound unit.  Impression:  Cyst versus joint mouse, extra-articular  Impression and Recommendations:

## 2014-03-25 NOTE — Assessment & Plan Note (Signed)
Refilling baclofen

## 2014-03-25 NOTE — Patient Instructions (Signed)
Seborrheic Keratosis Seborrheic keratosis is a common, noncancerous (benign) skin growth that can occur anywhere on the skin.It looks like "stuck-on," waxy, rough, tan, brown, or black spots on the skin. These skin growths can be flat or raised.They are often called "barnacles" because of their pasted-on appearance.Usually, these skin growths appear in adulthood, around age 40, and increase in number as you age. They may also develop during pregnancy or following estrogen therapy. Many people may only have one growth appear in their lifetime, while some people may develop many growths. CAUSES It is unknown what causes these skin growths, but they appear to run in families. SYMPTOMS Seborrheic keratosis is often located on the face, chest, shoulders, back, or other areas. These growths are:  Usually painless, but may become irritated and itchy.  Yellow, brown, black, or other colors.  Slightly raised or have a flat surface.  Sometimes rough or wart-like in texture.  Often waxy on the surface.  Round or oval-shaped.  Sometimes "stuck-on" in appearance.  Sometimes single, but there are usually many growths. Any growth that bleeds, itches on a regular basis, becomes inflamed, or becomes irritated needs to be evaluated by a skin specialist (dermatologist). DIAGNOSIS Diagnosis is mainly based on the way the growths appear. In some cases, it can be difficult to tell this type of skin growth from skin cancer. A skin growth tissue sample (biopsy) may be used to confirm the diagnosis. TREATMENT Most often, treatment is not needed because the skin growths are benign.If the skin growth is irritated easily by clothing or jewelry, causing it to scab or bleed, treatment may be recommended. Patients may also choose to have the growths removed because they do not like their appearance. Most commonly, these growths are treated with cryosurgery. In cryosurgery, liquid nitrogen is applied to "freeze" the  growth. The growth usually falls off within a matter of days. A blister may form and dry into a scab that will also fall off. After the growth or scab falls off, it may leave a dark or light spot on the skin. This color may fade over time, or it may remain permanent on the skin. HOME CARE INSTRUCTIONS If the skin growths are treated with cryosurgery, the treated area needs to be kept clean with water and soap. SEEK MEDICAL CARE IF:  You have questions about these growths or other skin problems.  You develop new symptoms, including:  A change in the appearance of the skin growth.  New growths.  Any bleeding, itching, or pain in the growths.  A skin growth that looks similar to seborrheic keratosis. Document Released: 04/17/2010 Document Revised: 06/07/2011 Document Reviewed: 04/17/2010 [employer] Patient Information 2015 [employer]. This information is not intended to replace advice given to you by your health care provider. Make sure you discuss any questions you have with your health care provider.  

## 2014-06-03 ENCOUNTER — Ambulatory Visit (INDEPENDENT_AMBULATORY_CARE_PROVIDER_SITE_OTHER): Payer: BLUE CROSS/BLUE SHIELD | Admitting: Physician Assistant

## 2014-06-03 ENCOUNTER — Encounter: Payer: Self-pay | Admitting: Physician Assistant

## 2014-06-03 VITALS — BP 119/81 | HR 80 | Ht 68.0 in | Wt 189.0 lb

## 2014-06-03 DIAGNOSIS — R300 Dysuria: Secondary | ICD-10-CM | POA: Diagnosis not present

## 2014-06-03 DIAGNOSIS — N3000 Acute cystitis without hematuria: Secondary | ICD-10-CM | POA: Diagnosis not present

## 2014-06-03 LAB — POCT URINALYSIS DIPSTICK
BILIRUBIN UA: NEGATIVE
Glucose, UA: NEGATIVE
Ketones, UA: NEGATIVE
Nitrite, UA: NEGATIVE
PH UA: 6
Protein, UA: NEGATIVE
RBC UA: NEGATIVE
Spec Grav, UA: <=1.005
Urobilinogen, UA: 0.2

## 2014-06-03 MED ORDER — CIPROFLOXACIN HCL 500 MG PO TABS: 500.0000 mg | ORAL_TABLET | Freq: Two times a day (BID) | ORAL | Status: DC

## 2014-06-03 NOTE — Patient Instructions (Signed)

## 2014-06-03 NOTE — Progress Notes (Signed)
   Subjective:    Patient ID: Julie Ibarra, female    DOB: [birth year], 39 y.o.   MRN: [id number]  HPI Pt presents to the clinic with painful urination that started this am. No fever, chills, nausea, flank or abdominal pain. Hx of UTI's. She has MS and on medication with potential side effect. Not tried anything to make better.    Review of Systems  All other systems reviewed and are negative.      Objective:   Physical Exam  Constitutional: She is oriented to person, place, and time. She appears well-developed and well-nourished.  HENT:  Head: Normocephalic and atraumatic.  Cardiovascular: Normal rate, regular rhythm and normal heart sounds.   Pulmonary/Chest: Effort normal and breath sounds normal.  No CVA tenderness.   Abdominal: Soft. Bowel sounds are normal. She exhibits no distension and no mass. There is no tenderness. There is no rebound and no guarding.  Neurological: She is alert and oriented to person, place, and time.  Skin: Skin is dry.  Psychiatric: She has a normal mood and affect. Her behavior is normal.          Assessment & Plan:  Acute cysitis without hematuria- .. Results for orders placed or performed in visit on 06/03/14  POCT urinalysis dipstick  Result Value Ref Range   Color, UA yellow    Clarity, UA slightly cloudy    Glucose, UA neg    Bilirubin, UA neg    Ketones, UA neg    Spec Grav, UA <=1.005    Blood, UA neg    pH, UA 6.0    Protein, UA neg    Urobilinogen, UA 0.2    Nitrite, UA neg    Leukocytes, UA moderate (2+)    will treat with cipro for 3 days. Pt has tolerated well in past. Will culture. Stay hydrated. HO given. Follow up if not improving or as needed.

## 2014-06-04 LAB — URINE CULTURE
Colony Count: NO GROWTH
Organism ID, Bacteria: NO GROWTH

## 2014-06-11 ENCOUNTER — Ambulatory Visit (INDEPENDENT_AMBULATORY_CARE_PROVIDER_SITE_OTHER): Payer: BLUE CROSS/BLUE SHIELD | Admitting: Sports Medicine

## 2014-06-11 ENCOUNTER — Encounter: Payer: Self-pay | Admitting: Sports Medicine

## 2014-06-11 VITALS — BP 111/72 | HR 75 | Wt 187.0 lb

## 2014-06-11 DIAGNOSIS — IMO0001 Reserved for inherently not codable concepts without codable children: Secondary | ICD-10-CM

## 2014-06-11 DIAGNOSIS — M5416 Radiculopathy, lumbar region: Secondary | ICD-10-CM

## 2014-06-11 DIAGNOSIS — M609 Myositis, unspecified: Secondary | ICD-10-CM

## 2014-06-11 DIAGNOSIS — M545 Low back pain, unspecified: Secondary | ICD-10-CM | POA: Insufficient documentation

## 2014-06-11 DIAGNOSIS — M791 Myalgia: Secondary | ICD-10-CM

## 2014-06-11 DIAGNOSIS — L821 Other seborrheic keratosis: Secondary | ICD-10-CM

## 2014-06-11 DIAGNOSIS — N3090 Cystitis, unspecified without hematuria: Secondary | ICD-10-CM

## 2014-06-11 LAB — URINALYSIS
Bilirubin Urine: NEGATIVE
Glucose, UA: NEGATIVE mg/dL
Hgb urine dipstick: NEGATIVE
Ketones, ur: NEGATIVE mg/dL
Leukocytes, UA: NEGATIVE
Nitrite: NEGATIVE
Protein, ur: NEGATIVE mg/dL
Specific Gravity, Urine: 1.009 (ref 1.005–1.030)
Urobilinogen, UA: 0.2 mg/dL (ref 0.0–1.0)
pH: 6.5 (ref 5.0–8.0)

## 2014-06-11 NOTE — Progress Notes (Signed)
  Subjective:    CC: Low back pain  HPI: For the past several weeks Julie Ibarra has had left-sided low back pain without radiculopathy, worse with standing, extension, rotation, not worse with flexion, Valsalva. It is very localized, moderate, persistent.  UTI: Symptoms have resolved, desiring test of cure.  Past medical history, Surgical history, Family history not pertinant except as noted below, Social history, Allergies, and medications have been entered into the medical record, reviewed, and no changes needed.   Review of Systems: No fevers, chills, night sweats, weight loss, chest pain, or shortness of breath.   Objective:    General: Well Developed, well nourished, and in no acute distress.  Neuro: Alert and oriented x3, extra-ocular muscles intact, sensation grossly intact.  HEENT: Normocephalic, atraumatic, pupils equal round reactive to light, neck supple, no masses, no lymphadenopathy, thyroid nonpalpable.  Skin: Warm and dry, no rashes. Cardiac: Regular rate and rhythm, no murmurs rubs or gallops, no lower extremity edema.  Respiratory: Clear to auscultation bilaterally. Not using accessory muscles, speaking in full sentences. Back Exam:  Inspection: Unremarkable  Motion: Flexion 45 deg, Extension 45 deg, Side Bending to 45 deg bilaterally,  Rotation to 45 deg bilaterally  SLR laying: Negative  XSLR laying: Negative  Palpable tenderness: Left paralumbar muscles. FABER: negative. Sensory change: Gross sensation intact to all lumbar and sacral dermatomes.  Reflexes: 2+ at both patellar tendons, 2+ at achilles tendons, Babinski's downgoing.  Strength at foot  Plantar-flexion: 5/5 Dorsi-flexion: 5/5 Eversion: 5/5 Inversion: 5/5  Leg strength  Quad: 5/5 Hamstring: 5/5 Hip flexor: 5/5 Hip abductors: 5/5  Gait unremarkable.  Procedure:  Injection of left paralumbar trigger point Consent obtained and verified. Time-out conducted. Noted no overlying erythema, induration, or  other signs of local infection. Skin prepped in a sterile fashion. Topical analgesic spray: Ethyl chloride. Completed without difficulty. Meds: 1 mL kenalog 40, 1 cc lidocaine injected in a fanlike pattern into the affected musculature. Pain immediately improved suggesting accurate placement of the medication. Advised to call if fevers/chills, erythema, induration, drainage, or persistent bleeding.   Impression and Recommendations:

## 2014-06-11 NOTE — Assessment & Plan Note (Signed)
Having some muscle spasm in the left paralumbar muscles, there is minimal pain over the SI joint with a negative Patrick's test. We are going to do a simple trigger point injection, and she will work harder with lumbar spine rehabilitation exercises, return to see me in 4 weeks, MRI if no better.

## 2014-06-11 NOTE — Assessment & Plan Note (Signed)
Cryotherapy 1 We can look at this again when she returns in a month.

## 2014-07-10 ENCOUNTER — Ambulatory Visit (INDEPENDENT_AMBULATORY_CARE_PROVIDER_SITE_OTHER): Payer: BLUE CROSS/BLUE SHIELD | Admitting: Sports Medicine

## 2014-07-10 ENCOUNTER — Encounter: Payer: Self-pay | Admitting: Sports Medicine

## 2014-07-10 VITALS — BP 126/74 | HR 75 | Ht 68.0 in | Wt 184.0 lb

## 2014-07-10 DIAGNOSIS — L821 Other seborrheic keratosis: Secondary | ICD-10-CM | POA: Diagnosis not present

## 2014-07-10 DIAGNOSIS — M5416 Radiculopathy, lumbar region: Secondary | ICD-10-CM

## 2014-07-10 NOTE — Progress Notes (Signed)
  Subjective:    CC: Followup  HPI: Lumbar radiculopathy: resolved with trigger point injections and rehab exercises at the last visit.  Seborrheic keratosis:  Nearly resolved on right face with cryotherapy at the last visit.  Past medical history, Surgical history, Family history not pertinant except as noted below, Social history, Allergies, and medications have been entered into the medical record, reviewed, and no changes needed.   Review of Systems: No fevers, chills, night sweats, weight loss, chest pain, or shortness of breath.   Objective:    General: Well Developed, well nourished, and in no acute distress.  Neuro: Alert and oriented x3, extra-ocular muscles intact, sensation grossly intact.  HEENT: Normocephalic, atraumatic, pupils equal round reactive to light, neck supple, no masses, no lymphadenopathy, thyroid nonpalpable.  Skin: Warm and dry, no rashes. Cardiac: Regular rate and rhythm, no murmurs rubs or gallops, no lower extremity edema.  Respiratory: Clear to auscultation bilaterally. Not using accessory muscles, speaking in full sentences.  Procedure:  Cryodestruction of several (8) cutaneous seborrheic keratoses and moles on face. Consent obtained and verified. Time-out conducted. Noted no overlying erythema, induration, or other signs of local infection. Completed without difficulty using Cryo-Gun. Advised to call if fevers/chills, erythema, induration, drainage, or persistent bleeding.  Impression and Recommendations:

## 2014-07-10 NOTE — Assessment & Plan Note (Signed)
Response to cryotherapy on the right side of the face. We did this again, and I also did cryotherapy on several other seborrheic keratoses, moles on the left side of her face and neck, 8 in total. Return in one month for a repeat, we took pictures before the procedure.

## 2014-07-10 NOTE — Assessment & Plan Note (Signed)
Essentially pain-free, rehabilitation exercises given. She will continue these daily.

## 2014-08-07 ENCOUNTER — Ambulatory Visit: Payer: BLUE CROSS/BLUE SHIELD | Admitting: Sports Medicine

## 2014-08-16 ENCOUNTER — Ambulatory Visit (INDEPENDENT_AMBULATORY_CARE_PROVIDER_SITE_OTHER): Payer: BLUE CROSS/BLUE SHIELD | Admitting: Sports Medicine

## 2014-08-16 ENCOUNTER — Encounter: Payer: Self-pay | Admitting: Sports Medicine

## 2014-08-16 VITALS — BP 107/66 | HR 84 | Ht 68.0 in | Wt 183.0 lb

## 2014-08-16 DIAGNOSIS — L821 Other seborrheic keratosis: Secondary | ICD-10-CM

## 2014-08-16 NOTE — Progress Notes (Signed)
  Subjective:    CC: Follow-up  HPI: Skin lesions: The right-sided seborrheic keratosis has almost completely disappeared, the left-sided skin lesions have improved significantly and the patient does desire repeat cryotherapy. Symptoms are mild, improving.  Past medical history, Surgical history, Family history not pertinant except as noted below, Social history, Allergies, and medications have been entered into the medical record, reviewed, and no changes needed.   Review of Systems: No fevers, chills, night sweats, weight loss, chest pain, or shortness of breath.   Objective:    General: Well Developed, well nourished, and in no acute distress.  Neuro: Alert and oriented x3, extra-ocular muscles intact, sensation grossly intact.  HEENT: Normocephalic, atraumatic, pupils equal round reactive to light, neck supple, no masses, no lymphadenopathy, thyroid nonpalpable.  Skin: Warm and dry, no rashes. Cardiac: Regular rate and rhythm, no murmurs rubs or gallops, no lower extremity edema.  Respiratory: Clear to auscultation bilaterally. Not using accessory muscles, speaking in full sentences.  Procedure:  Cryodestruction of several (7) cutaneous seborrheic keratoses and moles on face. Consent obtained and verified. Time-out conducted. Noted no overlying erythema, induration, or other signs of local infection. Completed without difficulty using Cryo-Gun. Advised to call if fevers/chills, erythema, induration, drainage, or persistent bleeding.  Impression and Recommendations:

## 2014-08-16 NOTE — Assessment & Plan Note (Signed)
Excellent response to the first cryotherapy with near complete flattening of all skin lesions on the left side of the cheek, I performed a second cryotherapy on approximately 7 seborrheic keratoses and moles. The seborrheic keratosis on the right side of her face is imperceptible now post 2 sessions of cryotherapy. She can return to see me on an as-needed basis. Again we took pictures before this procedure to compare to prior.

## 2014-09-16 ENCOUNTER — Ambulatory Visit: Payer: BLUE CROSS/BLUE SHIELD | Admitting: Sports Medicine

## 2015-04-09 ENCOUNTER — Other Ambulatory Visit: Payer: Self-pay | Admitting: Sports Medicine

## 2015-09-23 ENCOUNTER — Ambulatory Visit (INDEPENDENT_AMBULATORY_CARE_PROVIDER_SITE_OTHER): Payer: BLUE CROSS/BLUE SHIELD | Admitting: Sports Medicine

## 2015-09-23 ENCOUNTER — Encounter: Payer: Self-pay | Admitting: Sports Medicine

## 2015-09-23 VITALS — BP 94/67 | HR 92 | Resp 18 | Wt 182.8 lb

## 2015-09-23 DIAGNOSIS — J069 Acute upper respiratory infection, unspecified: Secondary | ICD-10-CM

## 2015-09-23 LAB — POCT RAPID STREP A (OFFICE): Rapid Strep A Screen: NEGATIVE

## 2015-09-23 NOTE — Patient Instructions (Signed)
Upper Respiratory Infection, Adult Most upper respiratory infections (URIs) are a viral infection of the air passages leading to the lungs. A URI affects the nose, throat, and upper air passages. The most common type of URI is nasopharyngitis and is typically referred to as "the common cold." URIs run their course and usually go away on their own. Most of the time, a URI does not require medical attention, but sometimes a bacterial infection in the upper airways can follow a viral infection. This is called a secondary infection. Sinus and middle ear infections are common types of secondary upper respiratory infections. Bacterial pneumonia can also complicate a URI. A URI can worsen asthma and chronic obstructive pulmonary disease (COPD). Sometimes, these complications can require emergency medical care and may be life threatening.  CAUSES Almost all URIs are caused by viruses. A virus is a type of germ and can spread from one person to another.  RISKS FACTORS You may be at risk for a URI if:   You smoke.   You have chronic heart or lung disease.  You have a weakened defense (immune) system.   You are very young or very old.   You have nasal allergies or asthma.  You work in crowded or poorly ventilated areas.  You work in health care facilities or schools. SIGNS AND SYMPTOMS  Symptoms typically develop 2-3 days after you come in contact with a cold virus. Most viral URIs last 7-10 days. However, viral URIs from the influenza virus (flu virus) can last 14-18 days and are typically more severe. Symptoms may include:   Runny or stuffy (congested) nose.   Sneezing.   Cough.   Sore throat.   Headache.   Fatigue.   Fever.   Loss of appetite.   Pain in your forehead, behind your eyes, and over your cheekbones (sinus pain).  Muscle aches.  DIAGNOSIS  Your health care provider may diagnose a URI by:  Physical exam.  Tests to check that your symptoms are not due to  another condition such as:  Strep throat.  Sinusitis.  Pneumonia.  Asthma. TREATMENT  A URI goes away on its own with time. It cannot be cured with medicines, but medicines may be prescribed or recommended to relieve symptoms. Medicines may help:  Reduce your fever.  Reduce your cough.  Relieve nasal congestion. HOME CARE INSTRUCTIONS   Take medicines only as directed by your health care provider.   Gargle warm saltwater or take cough drops to comfort your throat as directed by your health care provider.  Use a warm mist humidifier or inhale steam from a shower to increase air moisture. This may make it easier to breathe.  Drink enough fluid to keep your urine clear or pale yellow.   Eat soups and other clear broths and maintain good nutrition.   Rest as needed.   Return to work when your temperature has returned to normal or as your health care provider advises. You may need to stay home longer to avoid infecting others. You can also use a face mask and careful hand washing to prevent spread of the virus.  Increase the usage of your inhaler if you have asthma.   Do not use any tobacco products, including cigarettes, chewing tobacco, or electronic cigarettes. If you need help quitting, ask your health care provider. PREVENTION  The best way to protect yourself from getting a cold is to practice good hygiene.   Avoid oral or hand contact with people with cold   symptoms.   Wash your hands often if contact occurs.  There is no clear evidence that vitamin C, vitamin E, echinacea, or exercise reduces the chance of developing a cold. However, it is always recommended to get plenty of rest, exercise, and practice good nutrition.  SEEK MEDICAL CARE IF:   You are getting worse rather than better.   Your symptoms are not controlled by medicine.   You have chills.  You have worsening shortness of breath.  You have brown or red mucus.  You have yellow or brown nasal  discharge.  You have pain in your face, especially when you bend forward.  You have a fever.  You have swollen neck glands.  You have pain while swallowing.  You have white areas in the back of your throat. SEEK IMMEDIATE MEDICAL CARE IF:   You have severe or persistent:  Headache.  Ear pain.  Sinus pain.  Chest pain.  You have chronic lung disease and any of the following:  Wheezing.  Prolonged cough.  Coughing up blood.  A change in your usual mucus.  You have a stiff neck.  You have changes in your:  Vision.  Hearing.  Thinking.  Mood. MAKE SURE YOU:   Understand these instructions.  Will watch your condition.  Will get help right away if you are not doing well or get worse.   This information is not intended to replace advice given to you by your health care provider. Make sure you discuss any questions you have with your health care provider.   Document Released: 09/08/2000 Document Revised: 07/30/2014 Document Reviewed: 06/20/2013 [employer] Interactive Patient Education 2016 [employer]  

## 2015-09-23 NOTE — Progress Notes (Signed)
  Subjective:    CC: Feeling sick  HPI: This is a pleasant 40 year old female, for 1 week she's had sore throat, mild cough, malaise. No rash, no sick contacts. Symptoms are moderate, persistent.  Past medical history, Surgical history, Family history not pertinant except as noted below, Social history, Allergies, and medications have been entered into the medical record, reviewed, and no changes needed.   Review of Systems: No fevers, chills, night sweats, weight loss, chest pain, or shortness of breath.   Objective:    General: Well Developed, well nourished, and in no acute distress.  Neuro: Alert and oriented x3, extra-ocular muscles intact, sensation grossly intact.  HEENT: Normocephalic, atraumatic, pupils equal round reactive to light, neck supple, no masses, no lymphadenopathy, thyroid nonpalpable. Oropharynx, nasopharynx, ear canals unremarkable. Skin: Warm and dry, no rashes. Cardiac: Regular rate and rhythm, no murmurs rubs or gallops, no lower extremity edema.  Respiratory: Clear to auscultation bilaterally. Not using accessory muscles, speaking in full sentences.  Rapid strep test is negative  Impression and Recommendations:   I spent 25 minutes with this patient, greater than 50% was face-to-face time counseling regarding the above diagnoses

## 2015-09-23 NOTE — Assessment & Plan Note (Signed)
Benign exam. Negative strep test. Vimovo samples given for pain relief. Return as needed

## 2015-09-25 ENCOUNTER — Ambulatory Visit (INDEPENDENT_AMBULATORY_CARE_PROVIDER_SITE_OTHER): Payer: BLUE CROSS/BLUE SHIELD | Admitting: Osteopathic Medicine

## 2015-09-25 ENCOUNTER — Encounter: Payer: Self-pay | Admitting: Osteopathic Medicine

## 2015-09-25 VITALS — BP 101/67 | HR 99 | Temp 98.3°F | Ht 68.0 in | Wt 185.0 lb

## 2015-09-25 DIAGNOSIS — H6981 Other specified disorders of Eustachian tube, right ear: Secondary | ICD-10-CM | POA: Diagnosis not present

## 2015-09-25 MED ORDER — IPRATROPIUM BROMIDE 0.03 % NA SOLN: 2.0000 | Freq: Three times a day (TID) | NASAL | Status: DC

## 2015-09-25 MED ORDER — AMOXICILLIN-POT CLAVULANATE 875-125 MG PO TABS: 1.0000 | ORAL_TABLET | Freq: Two times a day (BID) | ORAL | Status: DC

## 2015-09-25 NOTE — Patient Instructions (Signed)
DR. Junaid Wurzer'S HOME CARE INSTRUCTIONS: VIRAL ILLNESSES - ACHES/PAINS, SORE THROAT, SINUSITIS, COUGH, GASTRITIS   FRIST, A FEW NOTES ON OVER-THE-COUNTER MEDICATIONS!   USE CAUTION - MANY OVER-THE-COUNTER MEDICATIONS COME IN COMBINATIONS OF MULTIPLE GENERIC MEDICINES. FOR INSTANCE, NYQUIL HAS TYLENOL + COUGH MEDICINE + AN ANTIHISTAMINE, SO BE CAREFUL YOU'RE NOT TAKING A COMBINATION MEDICINE WHICH CONTAINS MEDICATIONS YOU'RE ALSO TAKING SEPARATELY (LIKE NYQUIL SYRUP PLUS TYLENOL PILLS).   YOUR PHARMACIST CAN HELP YOU AVOID MEDICATION INTERACTIONS AND DUPLICATIONS - ASK FOR THEIR HELP IF YOU ARE CONFUSED OR UNSURE ABOUT WHAT TO PURCHASE OVER-THE-COUNTER!   REMEMBER - IF YOU'RE EVER CONCERNED ABOUT MEDICATION SIDE EFFECTS, OR IF YOU'RE EVER CONCERNED YOUR SYMPTOMS ARE GETTING WORSE DESPITE TREATMENT, PLEASE CALL THE DOCTOR'S OFFICE! IF AFTER-HOURS, YOU CAN BE SEEN IN URGENT CARE OR, FOR SEVERE ILLNESS, PLEASE GO TO THE EMERGENCY ROOM.   TREAT SINUS CONGESTION, RUNNY NOSE & POSTNASAL DRIP:  Treat to increase airflow through sinuses, decrease congestion pain and prevent bacterial growth!   Remember, only 0.5-2% of sinus infections are due to a bacteria, the rest are due to a virus (usually the common cold) which will not get better with antibiotics!  NASAL SPRAYS: generally safe and should not interact with other medicines. Can take any of these medications, either alone or together... FLONASE (FLUTICASONE) - 2 sprays in each nostril twice per day (also a great allergy medicine to use long-term!) AFRIN (OXYMETOLAZONE) - use sparingly because it will cause rebound congestion, NEVER USE IN KIDS  SALINE NASAL SPRAY- no limit, but avoid use after other nasal sprays or it can wash the medicine away PRESCRTIPTION ATROVENT - as directed on prescription bottle  ANTIHISTAMINES: Helps dry out runny nose and decreases postnasal drip. Benadryl may cause drowsiness but other preparations should not be  as sedating. Certain kinds are not as safe in elderly individuals. OK to use unless Dr A says otherwise.  Only use one of the following... BENADRYL (DIPHENHYDRAMINE) - 25-50 mg every 6 hours ZYRTEC (CETIRIZINE) - 5-10 mg daily CLARITIN (LORATIDINE) - less potent. 10 mg daily ALLEGRA (FEXOFENADINE) - least likely to cause drowsiness! 180 mg daily or 60 mg twice per day  DECONGESTANTS: Helps dry out runny nose and helps with sinus pain. May cause insomnia, or sometimes elevated heart rate. Can cause problems if used often in people with high blood pressure. OK to use unless Dr A says otherwise. NEVER USE IN KIDS UNDER 2 YEARS OLD. Only use one of the following... SUDAFED (PSEUDOEPHEDINE) - 60 mg every 4 - 6 hours, also comes in 120 mg extended release every 12 hours, maximum 240 mg in 24 hours.  SUDAED PE (PHENYLEPHRINE) - 10 mg every 4 - 6 hours, maximum 60 mg per day  COMBINATIONS OF ABOVE ANTIHISTAMINES + DECONGESTANTS: these usually require you to show your ID at the pharmacy counter. You can also purchase these medicines separately as noted above.  Only use one of the following... ZYRTEC-D (CETIRIZINE + PSEUDOEPHEDRINE) - 12 hour formulation as directed CLARITIN-D (LORATIDINE + PSEUDOEPHEDRINE) - 12 and 24 hour formulations as directed ALLEGRA-D (FEXOFENADINE + PSEUDOEPHEDRINE) - 12 and 24 hour formulations as directed  PRESCRIPTION TREATMENT FOR SINUS PROBLEMS: Can include nasal sprays, pills, or antibiotics in the case of true bacterial infection. Not everyone needs an antibiotic but there are other medicines which will help you feel better while your body fights the infection!    TREAT COUGH & SORE THROAT:  Remember, cough is the body's way of protecting your   airways and lungs, it's a hard-wired reflex that is tough for medicines to treat!   Irritation to the airways will cause cough. This irritation is usually caused by upper airway problems like postnasal drip (treat as above)  and viral sore throat, but in severe cases can be due to lower airway problems like bronchitis or pneumonia, which a doctor can usually hear on exam of your lungs or see on an X-ray.  Sore throat is almost always due to a virus, but occasionally caused by certain strains of Strep, which requires antibiotics.   Exercise and smoking may make cough worse - take it easy while you're sick, and QUIT SMOKING!   Cough due to viral infection can linger for 2-3 weeks or so. If you're coughing longer than you think you should, or if the cough is severe, please make an appointment in the office - you may need a chest X-ray.  EXPECTORANT: Used to help clear airways, take these with PLENTY of water to help thin mucus secretions and make the mucus easier to cough up  Only use one of the following... ROBITUSSIN (DEXTROMETHORPHAN OR GUAIFENISEN depending on formulation)  MUCINEX (GUAIFENICEN) - usually longer acting  COUGH DROPS/LOZENGES: Whichever over-the-counter agent you prefer!  Here are some suggestions for ingredients to look for (can take both)... BENZOCAINE - numbing effect, also in CHLORASEPTIC THROAT SPRAY MENTHOL - cooling effect  HONEY: has gone head-to-head in several clinical trials with prescription cough medicines and found to be equally effective! Try 1 Teaspoon Honey + 2 Drops Lemon Juice, as much as you want to use. NONE FOR KIDS UNDER AGE 2  HERBAL TEA: There are certain ingredients which help "coat the throat" to relieve pain  such as ELM BARK, LICORICE ROOT, MARSHMALLOW ROOT  PRESCRIPTION TREATMENT FOR COUGH: Reserved for severe cases. Can include pills, syrups or inhalers.    TREAT ACHES & PAINS, FEVER:  Illness causes aches, pains and fever as your body increases its natural inflammation response to help fight the infection.   Rest, good hydration and nutrition, and taking anti-inflammatory medications will help.   Remember: a true fever is a temperature 100.4 or  higher. If you have a fever that is 105.0 or higher, that is a dangerous level and needs medical attention in the office or in the ER!  Can take both of these together if it's ok with your doctor... IBUPROFEN - 400-800 mg every 6 - 8 hours. Ibuprofen and similar medications can cause problems for people with heart disease or history of stomach ulcers, check with Dr A first if you're concerned. Lower doses are usually safe and effective.  TYLENOL (ACETAMINOPHEN) - 500-1000 mg every 6 hours. It won't cause problems with heart or stomach.     TREAT GASTROINTESTINAL SYMPTOMS:  Hydrate, hydrate, hydrate! Try drinking Gatorade/Powerade, broth/soup. Avoid milk and juice, these can make diarrhea worse. You can drink water, of course, but if you are also having vomiting and loose stool you are losing electrolytes which water alone can't replace.  IMMODIUM (LOPERAMIDE) - as directed on the bottle to help with loose stool PRESCRIPTION ZOFRAN (ONDANSETRON) OR PHENERGAN (PROMETHAZINE) - as directed to help nausea and vomiting. Try taking these before you eat if you are having trouble keeping food down.     REMEMBER - THE MOST IMPORTANT THINGS YOU CAN DO TO AVOID CATCHING OR SPREADING ILLNESS INCLUDE:   COVER YOUR COUGH WITH YOUR ARM, NOT WITH YOUR HANDS!   DISINFECT COMMONLY USED SURFACES (SUCH AS   TELEPHONES & DOORKNOBS) WHEN YOU OR SOMEONE CLOSE TO YOU IS FEELING SICK!   BE SURE VACCINES ARE UP TO DATE - GET A FLU SHOT EVERY YEAR! THE FLU CAN BE DEADLY FOR BABIES, ELDERLY FOLKS, AND PEOPLE WITH WEAK IMMUNE SYSTEMS - YOU SHOULD BE VACCINATED TO HELP PREVENT YOURSELF FROM GETTING SICK, BUT ALSO TO PREVENT SOMEONE ELSE FROM GETTING AN INFECTION WHICH MAY HOSPITALIZE OR KILL THEM.  GOOD NUTRITION AND HEALTHY LIFESTYLE WILL HELP YOUR IMMUNE SYSTEM YEAR-ROUND! THERE IS NO MAGIC SUPPLEMENT!  AND ABOVE ALL - WASH YOUR HANDS OFTEN!   

## 2015-09-25 NOTE — Progress Notes (Signed)
HPI: Julie Ibarra is a 40 y.o. female who [facility] [place] for chief complaint of:  Chief Complaint  Patient presents with  . Ear Pain    RIGHT    . Context: Seen 2 days ago for viral upper respiratory infection, patient has not been taking over-the-counter medications . Location: R ear is not painful, some sinus pressure bilaterally as well . Quality: Feels like fluid in the ear, occasional sharp pain . Duration: Ear pain for less than 1 day, overall illness a bit less than a week  Past medical, social and family history reviewed: Past Medical History  Diagnosis Date  . Multiple sclerosis [facility](HCC)    Past Surgical History  Procedure Laterality Date  . Cholecystectomy    . Abdominal hysterectomy     Social History  Substance Use Topics  . Smoking status: Former Games developermoker  . Smokeless tobacco: Not on file  . Alcohol Use: Yes   Family History  Problem Relation Age of Onset  . Alcohol abuse Mother   . Hypertension Mother   . COPD Mother   . Alcohol abuse Father   . Heart disease Father   . Hyperlipidemia Father   . Hypertension Father     Current Outpatient Prescriptions  Medication Sig Dispense Refill  . baclofen (LIORESAL) 10 MG tablet TAKE ONE TABLET BY MOUTH TWICE DAILY 180 tablet 3  . CRANBERRY EXTRACT PO Take by mouth.    . escitalopram (LEXAPRO) 10 MG tablet Take 10 mg by mouth daily.    Marland Kitchen. gabapentin (NEURONTIN) 300 MG capsule Take 300 mg by mouth 3 (three) times daily.    . modafinil (PROVIGIL) 100 MG tablet Take 100 mg by mouth daily.    . Multiple Vitamin (MULTIVITAMIN) tablet Take 1 tablet by mouth daily.    . natalizumab (TYSABRI) 300 MG/15ML injection Inject into the vein.    . Probiotic Product (PROBIOTIC DAILY PO) Take by mouth.     No current facility-administered medications for this visit.   No Known Allergies    Review of Systems: CONSTITUTIONAL:  No  fever, no chills, (+) recent  illness HEAD/EYES/EARS/NOSE/THROAT: No  headache, no vision change, no hearing change, (+) sore throat, (+) sinus pressure CARDIAC: No  chest pain, No  pressure RESPIRATORY: (+)occasional cough, No  shortness of breath/wheeze GASTROINTESTINAL: No  nausea, No  vomiting, No  abdominal pain   Exam:  BP 101/67 mmHg  Pulse 99  Temp(Src) 98.3 F (36.8 C) (Oral)  Ht 5\' 8"  (1.727 m)  Wt 185 lb (83.915 kg)  BMI 28.14 kg/m2 Constitutional: VS see above. General Appearance: alert, well-developed, well-nourished, NAD Eyes: Normal lids, mild red conjunctiva on R but no discharge non-icteric sclera, PERRLA Ears, Nose, Mouth, Throat: MMM, Normal external inspection ears/nares/mouth/lips/gums, TM mild erythema on R, (+)light reflex, nonbulging, clear fluid behind TM, L TM normal, canals normal bilaterally. Pharynx/tonsils no erythema, no exudate. Nasal mucosa normal.  Neck: No masses, trachea midline. No thyroid enlargement. No tenderness/mass appreciated. No lymphadenopathy Respiratory: Normal respiratory effort. no wheeze, no rhonchi, no rales Cardiovascular: S1/S2 normal, no murmur, no rub/gallop auscultated. RRR.    Results for orders placed or performed in visit on 09/23/15 (from the past 72 hour(s))  POCT rapid strep A     Status: Normal   Collection Time: 09/23/15  2:08 PM  Result Value Ref Range   Rapid Strep A Screen Negative Negative     ASSESSMENT/PLAN: Most likely due to undertreated viral illness/viral sinus  infection and postnasal drip, resulting in eustachian tube dysfunction and increased right ear pressure. Does not appear to be infected, will trial over-the-counter antihistamine/decongestants plus ipratropium nasal spray, if patient no better in the next 24-36 hours, or if she gets worse in that time, would fill antibiotics.   Eustachian tube dysfunction, right - Plan: ipratropium (ATROVENT) 0.03 % nasal spray     All questions were answered. Visit summary with medication list  and pertinent instructions was printed for patient to review. ER/RTC precautions were reviewed with the patient with regard to worsening symptoms, headache, vision change, fever, hearing change, other concerns. Return if symptoms worsen or fail to improve.

## 2015-10-20 ENCOUNTER — Ambulatory Visit: Payer: BLUE CROSS/BLUE SHIELD | Admitting: Sports Medicine

## 2015-12-31 ENCOUNTER — Ambulatory Visit (INDEPENDENT_AMBULATORY_CARE_PROVIDER_SITE_OTHER): Payer: BLUE CROSS/BLUE SHIELD | Admitting: Sports Medicine

## 2015-12-31 VITALS — BP 110/64 | HR 80 | Temp 97.5°F

## 2015-12-31 DIAGNOSIS — N3000 Acute cystitis without hematuria: Secondary | ICD-10-CM

## 2015-12-31 DIAGNOSIS — Z23 Encounter for immunization: Secondary | ICD-10-CM | POA: Diagnosis not present

## 2015-12-31 DIAGNOSIS — R300 Dysuria: Secondary | ICD-10-CM

## 2015-12-31 LAB — POCT URINALYSIS DIPSTICK
Bilirubin, UA: NEGATIVE
Glucose, UA: NEGATIVE
Ketones, UA: NEGATIVE
Nitrite, UA: NEGATIVE
Protein, UA: NEGATIVE
Spec Grav, UA: 1.015
Urobilinogen, UA: 0.2
pH, UA: 7

## 2015-12-31 MED ORDER — CEPHALEXIN 500 MG PO CAPS: 500.0000 mg | ORAL_CAPSULE | Freq: Two times a day (BID) | ORAL | 0 refills | Status: DC

## 2015-12-31 NOTE — Assessment & Plan Note (Signed)
Adding Keflex, Urine culture. Return as needed.

## 2015-12-31 NOTE — Progress Notes (Signed)
Pt came into clinic today complaining of dysuria. Pt states she has had symptoms for about 48 hrs. Denies any flank/abdominal pain. Pt does report she has UTI's frequently and this is presenting the same way. POC Urine Dipstick completed in office and urine sent for culture. Pt advised an Rx will be sent to pharmacy on file and OK to take OTC AZO until Rx starts helping symptoms. Verbalized understanding. No further questions at this time.

## 2016-01-03 LAB — URINE CULTURE

## 2016-01-14 DIAGNOSIS — G35 Multiple sclerosis: Secondary | ICD-10-CM | POA: Diagnosis not present

## 2016-01-16 DIAGNOSIS — M7631 Iliotibial band syndrome, right leg: Secondary | ICD-10-CM | POA: Diagnosis not present

## 2016-01-16 DIAGNOSIS — M7072 Other bursitis of hip, left hip: Secondary | ICD-10-CM | POA: Diagnosis not present

## 2016-01-21 DIAGNOSIS — M7631 Iliotibial band syndrome, right leg: Secondary | ICD-10-CM | POA: Diagnosis not present

## 2016-01-21 DIAGNOSIS — M7072 Other bursitis of hip, left hip: Secondary | ICD-10-CM | POA: Diagnosis not present

## 2016-01-22 DIAGNOSIS — Z79899 Other long term (current) drug therapy: Secondary | ICD-10-CM | POA: Diagnosis not present

## 2016-01-22 DIAGNOSIS — G35 Multiple sclerosis: Secondary | ICD-10-CM | POA: Diagnosis not present

## 2016-01-27 DIAGNOSIS — M7072 Other bursitis of hip, left hip: Secondary | ICD-10-CM | POA: Diagnosis not present

## 2016-01-27 DIAGNOSIS — M7631 Iliotibial band syndrome, right leg: Secondary | ICD-10-CM | POA: Diagnosis not present

## 2016-02-02 DIAGNOSIS — M7631 Iliotibial band syndrome, right leg: Secondary | ICD-10-CM | POA: Diagnosis not present

## 2016-02-02 DIAGNOSIS — M7072 Other bursitis of hip, left hip: Secondary | ICD-10-CM | POA: Diagnosis not present

## 2016-02-13 DIAGNOSIS — M7072 Other bursitis of hip, left hip: Secondary | ICD-10-CM | POA: Diagnosis not present

## 2016-02-13 DIAGNOSIS — M7631 Iliotibial band syndrome, right leg: Secondary | ICD-10-CM | POA: Diagnosis not present

## 2016-02-17 DIAGNOSIS — M7072 Other bursitis of hip, left hip: Secondary | ICD-10-CM | POA: Diagnosis not present

## 2016-02-18 DIAGNOSIS — G35 Multiple sclerosis: Secondary | ICD-10-CM | POA: Diagnosis not present

## 2016-02-18 DIAGNOSIS — Z79899 Other long term (current) drug therapy: Secondary | ICD-10-CM | POA: Diagnosis not present

## 2016-02-24 DIAGNOSIS — M7072 Other bursitis of hip, left hip: Secondary | ICD-10-CM | POA: Diagnosis not present

## 2016-03-02 DIAGNOSIS — M7072 Other bursitis of hip, left hip: Secondary | ICD-10-CM | POA: Diagnosis not present

## 2016-03-08 DIAGNOSIS — M7072 Other bursitis of hip, left hip: Secondary | ICD-10-CM | POA: Diagnosis not present

## 2016-03-09 DIAGNOSIS — Z1231 Encounter for screening mammogram for malignant neoplasm of breast: Secondary | ICD-10-CM | POA: Diagnosis not present

## 2016-03-12 ENCOUNTER — Other Ambulatory Visit: Payer: Self-pay | Admitting: Sports Medicine

## 2016-03-16 DIAGNOSIS — M7072 Other bursitis of hip, left hip: Secondary | ICD-10-CM | POA: Diagnosis not present

## 2016-03-18 DIAGNOSIS — Z79899 Other long term (current) drug therapy: Secondary | ICD-10-CM | POA: Diagnosis not present

## 2016-03-18 DIAGNOSIS — G35 Multiple sclerosis: Secondary | ICD-10-CM | POA: Diagnosis not present

## 2016-03-26 DIAGNOSIS — G35 Multiple sclerosis: Secondary | ICD-10-CM | POA: Diagnosis not present

## 2016-03-30 DIAGNOSIS — M7631 Iliotibial band syndrome, right leg: Secondary | ICD-10-CM | POA: Diagnosis not present

## 2016-03-30 DIAGNOSIS — M7072 Other bursitis of hip, left hip: Secondary | ICD-10-CM | POA: Diagnosis not present

## 2016-03-31 DIAGNOSIS — G35 Multiple sclerosis: Secondary | ICD-10-CM | POA: Diagnosis not present

## 2016-04-06 DIAGNOSIS — M7072 Other bursitis of hip, left hip: Secondary | ICD-10-CM | POA: Diagnosis not present

## 2016-04-06 DIAGNOSIS — M7631 Iliotibial band syndrome, right leg: Secondary | ICD-10-CM | POA: Diagnosis not present

## 2016-04-10 DIAGNOSIS — G35 Multiple sclerosis: Secondary | ICD-10-CM | POA: Diagnosis not present

## 2016-04-13 DIAGNOSIS — M7631 Iliotibial band syndrome, right leg: Secondary | ICD-10-CM | POA: Diagnosis not present

## 2016-04-13 DIAGNOSIS — M7072 Other bursitis of hip, left hip: Secondary | ICD-10-CM | POA: Diagnosis not present

## 2016-04-26 DIAGNOSIS — N390 Urinary tract infection, site not specified: Secondary | ICD-10-CM | POA: Diagnosis not present

## 2016-04-27 DIAGNOSIS — M7072 Other bursitis of hip, left hip: Secondary | ICD-10-CM | POA: Diagnosis not present

## 2016-04-27 DIAGNOSIS — M7631 Iliotibial band syndrome, right leg: Secondary | ICD-10-CM | POA: Diagnosis not present

## 2016-05-11 DIAGNOSIS — M7631 Iliotibial band syndrome, right leg: Secondary | ICD-10-CM | POA: Diagnosis not present

## 2016-05-11 DIAGNOSIS — M7072 Other bursitis of hip, left hip: Secondary | ICD-10-CM | POA: Diagnosis not present

## 2016-05-18 DIAGNOSIS — G35 Multiple sclerosis: Secondary | ICD-10-CM | POA: Diagnosis not present

## 2016-05-25 DIAGNOSIS — M7072 Other bursitis of hip, left hip: Secondary | ICD-10-CM | POA: Diagnosis not present

## 2016-05-25 DIAGNOSIS — M25552 Pain in left hip: Secondary | ICD-10-CM | POA: Diagnosis not present

## 2016-06-08 DIAGNOSIS — M7072 Other bursitis of hip, left hip: Secondary | ICD-10-CM | POA: Diagnosis not present

## 2016-06-08 DIAGNOSIS — G35 Multiple sclerosis: Secondary | ICD-10-CM | POA: Diagnosis not present

## 2016-06-08 DIAGNOSIS — M25552 Pain in left hip: Secondary | ICD-10-CM | POA: Diagnosis not present

## 2016-06-22 DIAGNOSIS — M25552 Pain in left hip: Secondary | ICD-10-CM | POA: Diagnosis not present

## 2016-06-22 DIAGNOSIS — M7072 Other bursitis of hip, left hip: Secondary | ICD-10-CM | POA: Diagnosis not present

## 2016-07-07 DIAGNOSIS — M7072 Other bursitis of hip, left hip: Secondary | ICD-10-CM | POA: Diagnosis not present

## 2016-07-07 DIAGNOSIS — M25552 Pain in left hip: Secondary | ICD-10-CM | POA: Diagnosis not present

## 2016-07-20 DIAGNOSIS — M7072 Other bursitis of hip, left hip: Secondary | ICD-10-CM | POA: Diagnosis not present

## 2016-07-20 DIAGNOSIS — M25552 Pain in left hip: Secondary | ICD-10-CM | POA: Diagnosis not present

## 2016-07-28 ENCOUNTER — Ambulatory Visit (INDEPENDENT_AMBULATORY_CARE_PROVIDER_SITE_OTHER): Payer: BLUE CROSS/BLUE SHIELD | Admitting: Physician Assistant

## 2016-07-28 VITALS — BP 113/79 | HR 109 | Temp 98.8°F | Wt 190.0 lb

## 2016-07-28 DIAGNOSIS — J00 Acute nasopharyngitis [common cold]: Secondary | ICD-10-CM

## 2016-07-28 DIAGNOSIS — J029 Acute pharyngitis, unspecified: Secondary | ICD-10-CM | POA: Diagnosis not present

## 2016-07-28 LAB — POCT RAPID STREP A (OFFICE): Rapid Strep A Screen: NEGATIVE

## 2016-07-28 MED ORDER — IPRATROPIUM BROMIDE 0.06 % NA SOLN: 1.0000 | Freq: Four times a day (QID) | NASAL | 0 refills | Status: DC | PRN

## 2016-07-28 NOTE — Progress Notes (Signed)
HPI:                                                                Julie Ibarra is a 41 y.o. female who presents to [facility] today for sore throat  Patient with PMH of MS   Sore Throat   This is a new problem. The current episode started in the past 7 days (Sunday). The problem has been unchanged. Neither side of throat is experiencing more pain than the other. Maximum temperature: tactile. The fever has been present for 1 to 2 days. The pain is severe. Associated symptoms include congestion, a hoarse voice, a plugged ear sensation and trouble swallowing. Pertinent negatives include no ear pain or shortness of breath. She has had no exposure to strep. She has tried acetaminophen (severe cold and flu) for the symptoms.     Past Medical History:  Diagnosis Date  . Multiple sclerosis (HCC)    Past Surgical History:  Procedure Laterality Date  . ABDOMINAL HYSTERECTOMY    . CHOLECYSTECTOMY     Social History  Substance Use Topics  . Smoking status: Former Games developer  . Smokeless tobacco: Not on file  . Alcohol use Yes   family history includes Alcohol abuse in her father and mother; COPD in her mother; Heart disease in her father; Hyperlipidemia in her father; Hypertension in her father and mother.  ROS: negative except as noted in the HPI  Medications: Current Outpatient Prescriptions  Medication Sig Dispense Refill  . aspirin EC 81 MG tablet Take 81 mg by mouth daily.    . baclofen (LIORESAL) 10 MG tablet TAKE ONE TABLET BY MOUTH TWICE DAILY 180 tablet 3  . CRANBERRY EXTRACT PO Take by mouth.    . Dimethyl Fumarate 240 MG CPDR Take 240 mg by mouth 2 (two) times daily.    Marland Kitchen escitalopram (LEXAPRO) 10 MG tablet Take 10 mg by mouth daily.    Marland Kitchen gabapentin (NEURONTIN) 300 MG capsule Take 100 mg by mouth 2 (two) times daily.     Marland Kitchen ipratropium (ATROVENT) 0.06 % nasal spray Place 1 spray into both nostrils 4 (four) times daily as needed.  15 mL 0  . methylphenidate (RITALIN) 10 MG tablet Take 10 mg by mouth as needed.     . modafinil (PROVIGIL) 100 MG tablet Take 100 mg by mouth daily.    . Multiple Vitamin (MULTIVITAMIN) tablet Take 1 tablet by mouth daily.     No current facility-administered medications for this visit.    No Known Allergies     Objective:  BP 113/79   Pulse (!) 109   Temp 98.8 F (37.1 C) (Oral)   Wt 190 lb (86.2 kg)   BMI 28.89 kg/m  Gen: well-groomed, cooperative, ill-appearing, not toxic appearing, no distress HEENT: normal conjunctiva, TM's clear, nasal mucosa edematous, oropharynx clear, no tonsillar exudates, uvula midline, moist mucus membranes, no frontal or maxillary sinus tenderness Pulm: Normal work of breathing, normal phonation, clear to auscultation bilaterally, no wheezes, rales or rhonchi CV: tachycardic, regular rhythm, s1 and s2 distinct, no murmurs, clicks or rubs  Neuro: alert and oriented x 3, EOM's intact Lymph: no cervical adenopathy, patient endorses tenderness in the left tonsillar region Skin: warm and dry,  no rashes or lesions on exposed skin, no cyanosis   Results for orders placed or performed in visit on 07/28/16 (from the past 72 hour(s))  POCT rapid strep A     Status: Normal   Collection Time: 07/28/16 10:16 AM  Result Value Ref Range   Rapid Strep A Screen Negative Negative   No results found.    Assessment and Plan: 41 y.o. female with   1. Acute nasopharyngitis - Rapid strep negative - symptomatic management with nasal spray, sudafed and tylenol - ipratropium (ATROVENT) 0.06 % nasal spray; Place 1 spray into both nostrils 4 (four) times daily as needed.  Dispense: 15 mL; Refill: 0  Patient education and anticipatory guidance given Patient agrees with treatment plan Follow-up as needed if symptoms worsen or fail to improve  Levonne Hubert PA-C

## 2016-07-28 NOTE — Patient Instructions (Signed)
For nasal congestion and ear pressure - Prescription Atrovent nasal spray up to 4 times daily - OTC Sudafed (located behind the pharmacy counter)  every 4-6 hours. May cause sleep disturbance so recommend you do not take it after 3pm  For sore throat - Tylenol 650 mg every 6 hours as needed - Chloraseptic spray  - Throat lozenges

## 2016-08-17 ENCOUNTER — Other Ambulatory Visit: Payer: Self-pay | Admitting: Physician Assistant

## 2016-08-17 DIAGNOSIS — J00 Acute nasopharyngitis [common cold]: Secondary | ICD-10-CM

## 2016-08-18 DIAGNOSIS — M7072 Other bursitis of hip, left hip: Secondary | ICD-10-CM | POA: Diagnosis not present

## 2016-08-18 DIAGNOSIS — M545 Low back pain: Secondary | ICD-10-CM | POA: Diagnosis not present

## 2016-08-18 DIAGNOSIS — M25552 Pain in left hip: Secondary | ICD-10-CM | POA: Diagnosis not present

## 2016-08-18 DIAGNOSIS — M7631 Iliotibial band syndrome, right leg: Secondary | ICD-10-CM | POA: Diagnosis not present

## 2016-08-24 DIAGNOSIS — Z01419 Encounter for gynecological examination (general) (routine) without abnormal findings: Secondary | ICD-10-CM | POA: Diagnosis not present

## 2016-08-24 DIAGNOSIS — Z6828 Body mass index (BMI) 28.0-28.9, adult: Secondary | ICD-10-CM | POA: Diagnosis not present

## 2016-09-01 DIAGNOSIS — M7072 Other bursitis of hip, left hip: Secondary | ICD-10-CM | POA: Diagnosis not present

## 2016-09-01 DIAGNOSIS — M25552 Pain in left hip: Secondary | ICD-10-CM | POA: Diagnosis not present

## 2016-09-20 DIAGNOSIS — Z79899 Other long term (current) drug therapy: Secondary | ICD-10-CM | POA: Diagnosis not present

## 2016-09-20 DIAGNOSIS — G35 Multiple sclerosis: Secondary | ICD-10-CM | POA: Diagnosis not present

## 2017-01-13 DIAGNOSIS — Z23 Encounter for immunization: Secondary | ICD-10-CM | POA: Diagnosis not present

## 2017-02-01 DIAGNOSIS — M25552 Pain in left hip: Secondary | ICD-10-CM | POA: Diagnosis not present

## 2017-02-01 DIAGNOSIS — M7631 Iliotibial band syndrome, right leg: Secondary | ICD-10-CM | POA: Diagnosis not present

## 2017-02-01 DIAGNOSIS — M545 Low back pain: Secondary | ICD-10-CM | POA: Diagnosis not present

## 2017-02-18 DIAGNOSIS — G35 Multiple sclerosis: Secondary | ICD-10-CM | POA: Diagnosis not present

## 2017-02-24 DIAGNOSIS — M25552 Pain in left hip: Secondary | ICD-10-CM | POA: Diagnosis not present

## 2017-02-24 DIAGNOSIS — M7631 Iliotibial band syndrome, right leg: Secondary | ICD-10-CM | POA: Diagnosis not present

## 2017-03-03 DIAGNOSIS — G35 Multiple sclerosis: Secondary | ICD-10-CM | POA: Diagnosis not present

## 2017-03-14 ENCOUNTER — Other Ambulatory Visit: Payer: Self-pay | Admitting: Sports Medicine

## 2017-03-25 DIAGNOSIS — R5383 Other fatigue: Secondary | ICD-10-CM | POA: Diagnosis not present

## 2017-03-25 DIAGNOSIS — G35 Multiple sclerosis: Secondary | ICD-10-CM | POA: Diagnosis not present

## 2017-03-25 DIAGNOSIS — F329 Major depressive disorder, single episode, unspecified: Secondary | ICD-10-CM | POA: Diagnosis not present

## 2017-04-01 DIAGNOSIS — Z1231 Encounter for screening mammogram for malignant neoplasm of breast: Secondary | ICD-10-CM | POA: Diagnosis not present

## 2017-09-12 DIAGNOSIS — M25552 Pain in left hip: Secondary | ICD-10-CM | POA: Diagnosis not present

## 2017-09-12 DIAGNOSIS — M7072 Other bursitis of hip, left hip: Secondary | ICD-10-CM | POA: Diagnosis not present

## 2017-09-23 DIAGNOSIS — R5383 Other fatigue: Secondary | ICD-10-CM | POA: Diagnosis not present

## 2017-09-23 DIAGNOSIS — G35 Multiple sclerosis: Secondary | ICD-10-CM | POA: Diagnosis not present

## 2017-09-29 DIAGNOSIS — N390 Urinary tract infection, site not specified: Secondary | ICD-10-CM | POA: Diagnosis not present

## 2017-11-23 DIAGNOSIS — M25552 Pain in left hip: Secondary | ICD-10-CM | POA: Diagnosis not present

## 2017-11-24 ENCOUNTER — Encounter: Payer: Self-pay | Admitting: Sports Medicine

## 2017-11-24 ENCOUNTER — Ambulatory Visit: Payer: BLUE CROSS/BLUE SHIELD | Admitting: Sports Medicine

## 2017-11-24 DIAGNOSIS — M7662 Achilles tendinitis, left leg: Secondary | ICD-10-CM

## 2017-11-24 MED ORDER — MELOXICAM 15 MG PO TABS: ORAL_TABLET | ORAL | 3 refills | Status: DC

## 2017-11-24 MED ORDER — NITROGLYCERIN 0.2 MG/HR TD PT24: MEDICATED_PATCH | TRANSDERMAL | 11 refills | Status: DC

## 2017-11-24 NOTE — Patient Instructions (Signed)
Begin with easy walking, heel, toe and backwards  Do calf raises on a step:  First lower and then raise on 1 foot  If this is painful, lower on 1 foot, do the heel raise on both feet Begin with 3 sets of 10 repetitions  Increase by 5 repetitions every 3 days  Goal is 3 sets of 30 repetitions  Do with both straight knee and knee at 20 degrees of flexion  If pain persists, once you can do 3 sets of 30 without weight, add backpack with 5 lbs.  Increase by 5 lbs per week to max of 30 lbs for 3 sets of 15   

## 2017-11-24 NOTE — Progress Notes (Signed)
Subjective:    CC: Left ankle pain  HPI: For months this pleasant 42 year old female is a pain that she localizes over the posterior aspect of her left ankle, approximately 4 cm proximal to the Achilles insertion.  No nodules.  Worse with prolonged weightbearing.  Moderate, persistent, no radiation.  I reviewed the past medical history, family history, social history, surgical history, and allergies today and no changes were needed.  Please see the problem list section below in epic for further details.  Past Medical History: Past Medical History:  Diagnosis Date  . Multiple sclerosis (HCC)    Past Surgical History: Past Surgical History:  Procedure Laterality Date  . ABDOMINAL HYSTERECTOMY    . CHOLECYSTECTOMY     Social History: Social History   Socioeconomic History  . Marital status: Married    Spouse name: Not on file  . Number of children: Not on file  . Years of education: Not on file  . Highest education level: Not on file  Occupational History  . Not on file  Social Needs  . Financial resource strain: Not on file  . Food insecurity:    Worry: Not on file    Inability: Not on file  . Transportation needs:    Medical: Not on file    Non-medical: Not on file  Tobacco Use  . Smoking status: Former Games developer  . Smokeless tobacco: Never Used  Substance and Sexual Activity  . Alcohol use: Yes  . Drug use: No  . Sexual activity: Yes    Birth control/protection: None  Lifestyle  . Physical activity:    Days per week: Not on file    Minutes per session: Not on file  . Stress: Not on file  Relationships  . Social connections:    Talks on phone: Not on file    Gets together: Not on file    Attends religious service: Not on file    Active member of club or organization: Not on file    Attends meetings of clubs or organizations: Not on file    Relationship status: Not on file  Other Topics Concern  . Not on file  Social History Narrative  . Not on file    Family History: Family History  Problem Relation Age of Onset  . Alcohol abuse Mother   . Hypertension Mother   . COPD Mother   . Alcohol abuse Father   . Heart disease Father   . Hyperlipidemia Father   . Hypertension Father    Allergies: No Known Allergies Medications: See med rec.  Review of Systems: No fevers, chills, night sweats, weight loss, chest pain, or shortness of breath.   Objective:    General: Well Developed, well nourished, and in no acute distress.  Neuro: Alert and oriented x3, extra-ocular muscles intact, sensation grossly intact.  HEENT: Normocephalic, atraumatic, pupils equal round reactive to light, neck supple, no masses, no lymphadenopathy, thyroid nonpalpable.  Skin: Warm and dry, no rashes. Cardiac: Regular rate and rhythm, no murmurs rubs or gallops, no lower extremity edema.  Respiratory: Clear to auscultation bilaterally. Not using accessory muscles, speaking in full sentences. Left ankle: No visible erythema or swelling. Range of motion is full in all directions. Strength is 5/5 in all directions. Stable lateral and medial ligaments; squeeze test and kleiger test unremarkable; Talar dome nontender; No pain at base of 5th MT; No tenderness over cuboid; No tenderness over N spot or navicular prominence No tenderness on posterior aspects of lateral and  medial malleolus No sign of peroneal tendon subluxations; Negative tarsal tunnel tinel's Able to walk 4 steps. Tender to palpation over the mid Achilles, no pain over the calcaneal or retrocalcaneal bursa, she does have a mild Haglund's deformity but nontender.  Impression and Recommendations:    Left Achilles tendinosis Heel lifts, topical nitroglycerin, eccentric rehabilitation exercises, return to see me in 1 month, if not feeling any better we will get an MRI to confirm the diagnosis. I did discuss with her that this could take up to 8 weeks to get better. We discussed the pathophysiology  of tendinitis versus tendinosis. ___________________________________________ Thomas J. Benjamin Stainhekkekandam, M.D., ABFM., CAQSM. [facility]  Adjunct Instructor of Family Medicine  [employer]

## 2017-11-24 NOTE — Assessment & Plan Note (Signed)
Heel lifts, topical nitroglycerin, eccentric rehabilitation exercises, return to see me in 1 month, if not feeling any better we will get an MRI to confirm the diagnosis. I did discuss with her that this could take up to 8 weeks to get better. We discussed the pathophysiology of tendinitis versus tendinosis.

## 2018-01-18 DIAGNOSIS — Z23 Encounter for immunization: Secondary | ICD-10-CM | POA: Diagnosis not present

## 2018-01-19 DIAGNOSIS — M7072 Other bursitis of hip, left hip: Secondary | ICD-10-CM | POA: Diagnosis not present

## 2018-01-19 DIAGNOSIS — M25552 Pain in left hip: Secondary | ICD-10-CM | POA: Diagnosis not present

## 2018-01-24 DIAGNOSIS — M25552 Pain in left hip: Secondary | ICD-10-CM | POA: Diagnosis not present

## 2018-02-24 DIAGNOSIS — G35 Multiple sclerosis: Secondary | ICD-10-CM | POA: Diagnosis not present

## 2018-03-07 DIAGNOSIS — Z1272 Encounter for screening for malignant neoplasm of vagina: Secondary | ICD-10-CM | POA: Diagnosis not present

## 2018-03-07 DIAGNOSIS — Z01419 Encounter for gynecological examination (general) (routine) without abnormal findings: Secondary | ICD-10-CM | POA: Diagnosis not present

## 2018-03-07 DIAGNOSIS — Z1151 Encounter for screening for human papillomavirus (HPV): Secondary | ICD-10-CM | POA: Diagnosis not present

## 2018-03-07 DIAGNOSIS — Z87410 Personal history of cervical dysplasia: Secondary | ICD-10-CM | POA: Diagnosis not present

## 2018-03-28 DIAGNOSIS — G35 Multiple sclerosis: Secondary | ICD-10-CM | POA: Diagnosis not present

## 2018-03-28 DIAGNOSIS — Z79899 Other long term (current) drug therapy: Secondary | ICD-10-CM | POA: Diagnosis not present

## 2018-03-28 DIAGNOSIS — R5383 Other fatigue: Secondary | ICD-10-CM | POA: Diagnosis not present

## 2018-04-03 ENCOUNTER — Other Ambulatory Visit: Payer: Self-pay | Admitting: Sports Medicine

## 2018-04-03 DIAGNOSIS — M7662 Achilles tendinitis, left leg: Secondary | ICD-10-CM

## 2018-04-09 ENCOUNTER — Other Ambulatory Visit: Payer: Self-pay | Admitting: Sports Medicine

## 2018-04-21 DIAGNOSIS — Z1231 Encounter for screening mammogram for malignant neoplasm of breast: Secondary | ICD-10-CM | POA: Diagnosis not present

## 2018-04-21 DIAGNOSIS — Z1239 Encounter for other screening for malignant neoplasm of breast: Secondary | ICD-10-CM | POA: Diagnosis not present

## 2018-09-07 DIAGNOSIS — W540XXA Bitten by dog, initial encounter: Secondary | ICD-10-CM | POA: Diagnosis not present

## 2018-09-07 DIAGNOSIS — S80811A Abrasion, right lower leg, initial encounter: Secondary | ICD-10-CM | POA: Diagnosis not present

## 2018-09-09 ENCOUNTER — Other Ambulatory Visit: Payer: Self-pay | Admitting: Sports Medicine

## 2018-09-09 DIAGNOSIS — M7662 Achilles tendinitis, left leg: Secondary | ICD-10-CM

## 2018-09-25 DIAGNOSIS — G35 Multiple sclerosis: Secondary | ICD-10-CM | POA: Diagnosis not present

## 2018-09-25 DIAGNOSIS — R5383 Other fatigue: Secondary | ICD-10-CM | POA: Diagnosis not present

## 2018-12-11 DIAGNOSIS — L255 Unspecified contact dermatitis due to plants, except food: Secondary | ICD-10-CM | POA: Diagnosis not present

## 2019-01-01 DIAGNOSIS — M799 Soft tissue disorder, unspecified: Secondary | ICD-10-CM | POA: Diagnosis not present

## 2019-01-01 DIAGNOSIS — M542 Cervicalgia: Secondary | ICD-10-CM | POA: Diagnosis not present

## 2019-01-01 DIAGNOSIS — G35 Multiple sclerosis: Secondary | ICD-10-CM | POA: Diagnosis not present

## 2019-01-01 DIAGNOSIS — M6281 Muscle weakness (generalized): Secondary | ICD-10-CM | POA: Diagnosis not present

## 2019-01-03 DIAGNOSIS — M799 Soft tissue disorder, unspecified: Secondary | ICD-10-CM | POA: Diagnosis not present

## 2019-01-03 DIAGNOSIS — M542 Cervicalgia: Secondary | ICD-10-CM | POA: Diagnosis not present

## 2019-01-03 DIAGNOSIS — G35 Multiple sclerosis: Secondary | ICD-10-CM | POA: Diagnosis not present

## 2019-01-03 DIAGNOSIS — M6281 Muscle weakness (generalized): Secondary | ICD-10-CM | POA: Diagnosis not present

## 2019-01-05 DIAGNOSIS — Z23 Encounter for immunization: Secondary | ICD-10-CM | POA: Diagnosis not present

## 2019-01-08 DIAGNOSIS — M799 Soft tissue disorder, unspecified: Secondary | ICD-10-CM | POA: Diagnosis not present

## 2019-01-08 DIAGNOSIS — M6281 Muscle weakness (generalized): Secondary | ICD-10-CM | POA: Diagnosis not present

## 2019-01-08 DIAGNOSIS — G35 Multiple sclerosis: Secondary | ICD-10-CM | POA: Diagnosis not present

## 2019-01-08 DIAGNOSIS — M542 Cervicalgia: Secondary | ICD-10-CM | POA: Diagnosis not present

## 2019-01-10 DIAGNOSIS — M6281 Muscle weakness (generalized): Secondary | ICD-10-CM | POA: Diagnosis not present

## 2019-01-10 DIAGNOSIS — M799 Soft tissue disorder, unspecified: Secondary | ICD-10-CM | POA: Diagnosis not present

## 2019-01-10 DIAGNOSIS — M542 Cervicalgia: Secondary | ICD-10-CM | POA: Diagnosis not present

## 2019-01-10 DIAGNOSIS — G35 Multiple sclerosis: Secondary | ICD-10-CM | POA: Diagnosis not present

## 2019-01-15 DIAGNOSIS — M6281 Muscle weakness (generalized): Secondary | ICD-10-CM | POA: Diagnosis not present

## 2019-01-15 DIAGNOSIS — M542 Cervicalgia: Secondary | ICD-10-CM | POA: Diagnosis not present

## 2019-01-15 DIAGNOSIS — M799 Soft tissue disorder, unspecified: Secondary | ICD-10-CM | POA: Diagnosis not present

## 2019-01-15 DIAGNOSIS — G35 Multiple sclerosis: Secondary | ICD-10-CM | POA: Diagnosis not present

## 2019-01-17 DIAGNOSIS — G35 Multiple sclerosis: Secondary | ICD-10-CM | POA: Diagnosis not present

## 2019-01-17 DIAGNOSIS — M6281 Muscle weakness (generalized): Secondary | ICD-10-CM | POA: Diagnosis not present

## 2019-01-17 DIAGNOSIS — M542 Cervicalgia: Secondary | ICD-10-CM | POA: Diagnosis not present

## 2019-01-17 DIAGNOSIS — M799 Soft tissue disorder, unspecified: Secondary | ICD-10-CM | POA: Diagnosis not present

## 2019-01-24 DIAGNOSIS — M799 Soft tissue disorder, unspecified: Secondary | ICD-10-CM | POA: Diagnosis not present

## 2019-01-24 DIAGNOSIS — G35 Multiple sclerosis: Secondary | ICD-10-CM | POA: Diagnosis not present

## 2019-01-24 DIAGNOSIS — M542 Cervicalgia: Secondary | ICD-10-CM | POA: Diagnosis not present

## 2019-01-24 DIAGNOSIS — M6281 Muscle weakness (generalized): Secondary | ICD-10-CM | POA: Diagnosis not present

## 2019-01-29 DIAGNOSIS — M542 Cervicalgia: Secondary | ICD-10-CM | POA: Diagnosis not present

## 2019-01-29 DIAGNOSIS — M6281 Muscle weakness (generalized): Secondary | ICD-10-CM | POA: Diagnosis not present

## 2019-01-29 DIAGNOSIS — M799 Soft tissue disorder, unspecified: Secondary | ICD-10-CM | POA: Diagnosis not present

## 2019-01-29 DIAGNOSIS — G35 Multiple sclerosis: Secondary | ICD-10-CM | POA: Diagnosis not present

## 2019-01-31 DIAGNOSIS — M799 Soft tissue disorder, unspecified: Secondary | ICD-10-CM | POA: Diagnosis not present

## 2019-01-31 DIAGNOSIS — G35 Multiple sclerosis: Secondary | ICD-10-CM | POA: Diagnosis not present

## 2019-01-31 DIAGNOSIS — M6281 Muscle weakness (generalized): Secondary | ICD-10-CM | POA: Diagnosis not present

## 2019-01-31 DIAGNOSIS — M542 Cervicalgia: Secondary | ICD-10-CM | POA: Diagnosis not present

## 2019-02-01 ENCOUNTER — Other Ambulatory Visit: Payer: Self-pay | Admitting: Sports Medicine

## 2019-02-01 DIAGNOSIS — M7662 Achilles tendinitis, left leg: Secondary | ICD-10-CM

## 2019-02-23 DIAGNOSIS — G35 Multiple sclerosis: Secondary | ICD-10-CM | POA: Diagnosis not present

## 2019-03-05 ENCOUNTER — Other Ambulatory Visit: Payer: Self-pay | Admitting: Sports Medicine

## 2019-03-05 DIAGNOSIS — M7662 Achilles tendinitis, left leg: Secondary | ICD-10-CM

## 2019-03-05 MED ORDER — MELOXICAM 15 MG PO TABS: ORAL_TABLET | ORAL | 0 refills | Status: DC

## 2019-04-02 ENCOUNTER — Other Ambulatory Visit: Payer: Self-pay | Admitting: *Deleted

## 2019-04-02 MED ORDER — BACLOFEN 10 MG PO TABS: 10.0000 mg | ORAL_TABLET | Freq: Two times a day (BID) | ORAL | 3 refills | Status: DC

## 2019-04-11 DIAGNOSIS — Z79899 Other long term (current) drug therapy: Secondary | ICD-10-CM | POA: Diagnosis not present

## 2019-04-11 DIAGNOSIS — R5383 Other fatigue: Secondary | ICD-10-CM | POA: Diagnosis not present

## 2019-04-11 DIAGNOSIS — G35 Multiple sclerosis: Secondary | ICD-10-CM | POA: Diagnosis not present

## 2019-05-14 DIAGNOSIS — Z20828 Contact with and (suspected) exposure to other viral communicable diseases: Secondary | ICD-10-CM | POA: Diagnosis not present

## 2019-06-01 ENCOUNTER — Other Ambulatory Visit: Payer: Self-pay | Admitting: Sports Medicine

## 2019-06-01 ENCOUNTER — Ambulatory Visit: Payer: Self-pay | Attending: Internal Medicine

## 2019-06-01 DIAGNOSIS — Z23 Encounter for immunization: Secondary | ICD-10-CM | POA: Insufficient documentation

## 2019-06-01 NOTE — Progress Notes (Signed)
   Covid-19 Vaccination Clinic  Name:  Julie Ibarra    MRN: [id number] DOB: 1975-11-07  06/01/2019  Julie Ibarra was observed post Covid-19 immunization for 15 minutes without incident. She was provided with Vaccine Information Sheet and instruction to access the V-Safe system.   Julie Ibarra was instructed to call 911 with any severe reactions post vaccine: Marland Kitchen Difficulty breathing  . Swelling of face and throat  . A fast heartbeat  . A bad rash all over body  . Dizziness and weakness

## 2019-07-02 ENCOUNTER — Ambulatory Visit: Payer: Self-pay

## 2019-07-03 DIAGNOSIS — M25552 Pain in left hip: Secondary | ICD-10-CM | POA: Diagnosis not present

## 2019-07-03 DIAGNOSIS — M542 Cervicalgia: Secondary | ICD-10-CM | POA: Diagnosis not present

## 2019-07-03 DIAGNOSIS — M545 Low back pain: Secondary | ICD-10-CM | POA: Diagnosis not present

## 2019-07-04 ENCOUNTER — Ambulatory Visit: Payer: Self-pay | Attending: Internal Medicine

## 2019-07-04 DIAGNOSIS — Z23 Encounter for immunization: Secondary | ICD-10-CM

## 2019-07-04 NOTE — Progress Notes (Signed)
   Covid-19 Vaccination Clinic  Name:  Julie Ibarra    MRN: [id number] DOB: 12-22-1975  07/04/2019  [name] was observed post Covid-19 immunization for 15 minutes without incident. She was provided with Vaccine Information Sheet and instruction to access the V-Safe system.   [name] was instructed to call 911 with any severe reactions post vaccine: Marland Kitchen Difficulty breathing  . Swelling of face and throat  . A fast heartbeat  . A bad rash all over body  . Dizziness and weakness   Immunizations Administered    Name Date Dose VIS Date Route   Pfizer COVID-19 Vaccine 07/04/2019  3:16 PM 0.3 mL 03/09/2019 Intramuscular   Manufacturer: [employer], [employer]   Lot: [id number]   NDC: [address]-2

## 2019-07-05 DIAGNOSIS — M542 Cervicalgia: Secondary | ICD-10-CM | POA: Diagnosis not present

## 2019-07-05 DIAGNOSIS — M25552 Pain in left hip: Secondary | ICD-10-CM | POA: Diagnosis not present

## 2019-07-05 DIAGNOSIS — M545 Low back pain: Secondary | ICD-10-CM | POA: Diagnosis not present

## 2019-07-10 DIAGNOSIS — M25552 Pain in left hip: Secondary | ICD-10-CM | POA: Diagnosis not present

## 2019-07-10 DIAGNOSIS — M545 Low back pain: Secondary | ICD-10-CM | POA: Diagnosis not present

## 2019-07-10 DIAGNOSIS — M542 Cervicalgia: Secondary | ICD-10-CM | POA: Diagnosis not present

## 2019-07-12 DIAGNOSIS — M545 Low back pain: Secondary | ICD-10-CM | POA: Diagnosis not present

## 2019-07-12 DIAGNOSIS — M542 Cervicalgia: Secondary | ICD-10-CM | POA: Diagnosis not present

## 2019-07-12 DIAGNOSIS — M25552 Pain in left hip: Secondary | ICD-10-CM | POA: Diagnosis not present

## 2019-07-20 DIAGNOSIS — M25552 Pain in left hip: Secondary | ICD-10-CM | POA: Diagnosis not present

## 2019-07-20 DIAGNOSIS — M545 Low back pain: Secondary | ICD-10-CM | POA: Diagnosis not present

## 2019-07-20 DIAGNOSIS — M542 Cervicalgia: Secondary | ICD-10-CM | POA: Diagnosis not present

## 2019-07-24 DIAGNOSIS — M545 Low back pain: Secondary | ICD-10-CM | POA: Diagnosis not present

## 2019-07-24 DIAGNOSIS — M542 Cervicalgia: Secondary | ICD-10-CM | POA: Diagnosis not present

## 2019-07-24 DIAGNOSIS — M25552 Pain in left hip: Secondary | ICD-10-CM | POA: Diagnosis not present

## 2019-07-27 DIAGNOSIS — M545 Low back pain: Secondary | ICD-10-CM | POA: Diagnosis not present

## 2019-07-27 DIAGNOSIS — M542 Cervicalgia: Secondary | ICD-10-CM | POA: Diagnosis not present

## 2019-07-27 DIAGNOSIS — M25552 Pain in left hip: Secondary | ICD-10-CM | POA: Diagnosis not present

## 2019-07-31 DIAGNOSIS — M25552 Pain in left hip: Secondary | ICD-10-CM | POA: Diagnosis not present

## 2019-07-31 DIAGNOSIS — M545 Low back pain: Secondary | ICD-10-CM | POA: Diagnosis not present

## 2019-07-31 DIAGNOSIS — M542 Cervicalgia: Secondary | ICD-10-CM | POA: Diagnosis not present

## 2019-08-03 DIAGNOSIS — M545 Low back pain: Secondary | ICD-10-CM | POA: Diagnosis not present

## 2019-08-03 DIAGNOSIS — M542 Cervicalgia: Secondary | ICD-10-CM | POA: Diagnosis not present

## 2019-08-03 DIAGNOSIS — M25552 Pain in left hip: Secondary | ICD-10-CM | POA: Diagnosis not present

## 2019-10-26 DIAGNOSIS — G35 Multiple sclerosis: Secondary | ICD-10-CM | POA: Diagnosis not present

## 2019-12-04 DIAGNOSIS — Z1231 Encounter for screening mammogram for malignant neoplasm of breast: Secondary | ICD-10-CM | POA: Diagnosis not present

## 2019-12-04 LAB — HM MAMMOGRAPHY

## 2019-12-26 DIAGNOSIS — R928 Other abnormal and inconclusive findings on diagnostic imaging of breast: Secondary | ICD-10-CM | POA: Diagnosis not present

## 2020-01-25 DIAGNOSIS — Z23 Encounter for immunization: Secondary | ICD-10-CM | POA: Diagnosis not present

## 2020-01-25 DIAGNOSIS — Z90710 Acquired absence of both cervix and uterus: Secondary | ICD-10-CM | POA: Diagnosis not present

## 2020-01-25 DIAGNOSIS — Z01419 Encounter for gynecological examination (general) (routine) without abnormal findings: Secondary | ICD-10-CM | POA: Diagnosis not present

## 2020-01-25 DIAGNOSIS — Z87410 Personal history of cervical dysplasia: Secondary | ICD-10-CM | POA: Diagnosis not present

## 2020-01-25 DIAGNOSIS — Z566 Other physical and mental strain related to work: Secondary | ICD-10-CM | POA: Diagnosis not present

## 2020-02-22 DIAGNOSIS — G35 Multiple sclerosis: Secondary | ICD-10-CM | POA: Diagnosis not present

## 2020-02-22 DIAGNOSIS — J0130 Acute sphenoidal sinusitis, unspecified: Secondary | ICD-10-CM | POA: Diagnosis not present

## 2020-02-27 DIAGNOSIS — G35 Multiple sclerosis: Secondary | ICD-10-CM | POA: Diagnosis not present

## 2020-03-12 DIAGNOSIS — G35 Multiple sclerosis: Secondary | ICD-10-CM | POA: Diagnosis not present

## 2020-03-12 DIAGNOSIS — F32A Depression, unspecified: Secondary | ICD-10-CM | POA: Diagnosis not present

## 2020-03-31 DIAGNOSIS — G35 Multiple sclerosis: Secondary | ICD-10-CM | POA: Diagnosis not present

## 2020-03-31 DIAGNOSIS — Z79899 Other long term (current) drug therapy: Secondary | ICD-10-CM | POA: Diagnosis not present

## 2020-04-16 ENCOUNTER — Other Ambulatory Visit: Payer: Self-pay | Admitting: Sports Medicine

## 2020-05-31 DIAGNOSIS — F411 Generalized anxiety disorder: Secondary | ICD-10-CM | POA: Diagnosis not present

## 2020-06-06 DIAGNOSIS — F411 Generalized anxiety disorder: Secondary | ICD-10-CM | POA: Diagnosis not present

## 2020-06-19 DIAGNOSIS — F411 Generalized anxiety disorder: Secondary | ICD-10-CM | POA: Diagnosis not present

## 2020-06-24 DIAGNOSIS — R928 Other abnormal and inconclusive findings on diagnostic imaging of breast: Secondary | ICD-10-CM | POA: Diagnosis not present

## 2020-06-24 DIAGNOSIS — R922 Inconclusive mammogram: Secondary | ICD-10-CM | POA: Diagnosis not present

## 2020-06-24 DIAGNOSIS — N6323 Unspecified lump in the left breast, lower outer quadrant: Secondary | ICD-10-CM | POA: Diagnosis not present

## 2020-06-24 DIAGNOSIS — N6489 Other specified disorders of breast: Secondary | ICD-10-CM | POA: Diagnosis not present

## 2020-06-27 DIAGNOSIS — F411 Generalized anxiety disorder: Secondary | ICD-10-CM | POA: Diagnosis not present

## 2020-07-03 DIAGNOSIS — F411 Generalized anxiety disorder: Secondary | ICD-10-CM | POA: Diagnosis not present

## 2020-07-10 DIAGNOSIS — F411 Generalized anxiety disorder: Secondary | ICD-10-CM | POA: Diagnosis not present

## 2020-07-15 DIAGNOSIS — F411 Generalized anxiety disorder: Secondary | ICD-10-CM | POA: Diagnosis not present

## 2020-07-17 DIAGNOSIS — F411 Generalized anxiety disorder: Secondary | ICD-10-CM | POA: Diagnosis not present

## 2020-07-24 DIAGNOSIS — F411 Generalized anxiety disorder: Secondary | ICD-10-CM | POA: Diagnosis not present

## 2020-08-07 DIAGNOSIS — F411 Generalized anxiety disorder: Secondary | ICD-10-CM | POA: Diagnosis not present

## 2020-08-25 DIAGNOSIS — F411 Generalized anxiety disorder: Secondary | ICD-10-CM | POA: Diagnosis not present

## 2020-08-29 DIAGNOSIS — Z20822 Contact with and (suspected) exposure to covid-19: Secondary | ICD-10-CM | POA: Diagnosis not present

## 2020-08-29 DIAGNOSIS — Z03818 Encounter for observation for suspected exposure to other biological agents ruled out: Secondary | ICD-10-CM | POA: Diagnosis not present

## 2020-09-08 DIAGNOSIS — F411 Generalized anxiety disorder: Secondary | ICD-10-CM | POA: Diagnosis not present

## 2020-09-18 DIAGNOSIS — F411 Generalized anxiety disorder: Secondary | ICD-10-CM | POA: Diagnosis not present

## 2020-09-24 DIAGNOSIS — G35 Multiple sclerosis: Secondary | ICD-10-CM | POA: Diagnosis not present

## 2020-09-24 DIAGNOSIS — Z79899 Other long term (current) drug therapy: Secondary | ICD-10-CM | POA: Diagnosis not present

## 2020-10-01 DIAGNOSIS — G35 Multiple sclerosis: Secondary | ICD-10-CM | POA: Diagnosis not present

## 2020-10-15 DIAGNOSIS — S00431A Contusion of right ear, initial encounter: Secondary | ICD-10-CM | POA: Diagnosis not present

## 2020-10-15 DIAGNOSIS — Z6828 Body mass index (BMI) 28.0-28.9, adult: Secondary | ICD-10-CM | POA: Diagnosis not present

## 2020-10-16 DIAGNOSIS — F411 Generalized anxiety disorder: Secondary | ICD-10-CM | POA: Diagnosis not present

## 2020-11-09 DIAGNOSIS — S99922A Unspecified injury of left foot, initial encounter: Secondary | ICD-10-CM | POA: Diagnosis not present

## 2020-11-09 DIAGNOSIS — Z6828 Body mass index (BMI) 28.0-28.9, adult: Secondary | ICD-10-CM | POA: Diagnosis not present

## 2020-11-09 DIAGNOSIS — R030 Elevated blood-pressure reading, without diagnosis of hypertension: Secondary | ICD-10-CM | POA: Diagnosis not present

## 2020-11-12 ENCOUNTER — Ambulatory Visit: Payer: Self-pay | Admitting: Family Medicine

## 2020-11-12 NOTE — Progress Notes (Signed)
Subjective:    CC: L foot pain  I, Julie Ibarra, LAT, ATC, am serving as scribe for Dr. Clementeen Ibarra.  HPI: Pt is a 45 y/o female presenting w/ L foot pain ongoing since 8/13/2. Pt fx her 5th MT 30 years ago and has been training for the MS walk. Pt walked 15 mile on Sat and then afterwards, when grocery shopping, experienced a severe "pop" along the lateral aspect of foot and ankle. The next day, pt woke up w/ extreme pain.  Pt's walk is coming up in 3 weeks. She locates her pain to the lateral aspect of foot, ankle, and lower leg. Pt was [facility] UC and an XR r/u fx.   Patient has an upcoming 47 mile MS Walk in about 3 weeks. This is 50 miles over 2-3 days.   L foot swelling: slight Aggravating factors: bearing weight Treatments tried: rest, IBU, naproxen  Pertinent review of Systems: No fevers or chills  Relevant historical information: Multiple sclerosis well controlled.   Objective:    Vitals:   11/13/20 1038  BP: 112/78  Pulse: 76  SpO2: 99%   General: Well Developed, well nourished, and in no acute distress.   MSK: Left foot and ankle normal. Mildly tender palpation along the lateral ankle and foot. Normal ankle and foot motion. Some pain with resisted foot eversion and plantarflexion dorsiflexion with eversion. Specifically tender along course of distal peroneal tendon and peroneal tertius tendon Otherwise foot and ankle are nontender. Stable ligamentous exam Pulses capillary fill and sensation are intact distally.  Lab and Radiology Results  Diagnostic Limited MSK Ultrasound of: Left foot and ankle Peroneal tendons are normal-appearing at the ankle however distal to the lateral malleolus the peroneal longus tendon is enlarged with mild hypoechoic fluid surrounding tendon sheath consistent with tenosynovitis.  No visible tears are present within the tendon. Otherwise lateral foot structures are normal-appearing Impression: Peroneal longus  tenosynovitis.     Impression and Recommendations:    Assessment and Plan: 45 y.o. female with left foot and ankle pain.  Patient felt a pop and has developed significant pain now improving.  X-rays obtained at an urgent care were I do not have access to the imaging were reportedly normal.   Diagnosis consistent with irritation of the peroneal longus tendon.  Is possible she had a subluxation event.  Definitive tear is very unlikely. Plan for a bit of relative rest, compressive ankle sleeve, home exercise program taught in clinic today by ATC and Voltaren gel.  Spent some time today discussing her upcoming 50 mile walk in about 3 weeks.  She can probably participate in this especially if she is feeling better prior to the event.  Resting now will be helpful.  Certainly she can bailout on the walk if she needs to do while she is doing at and it is an important thing for her.  Reasonable to recheck with myself or her primary care provider Dr. Benjamin Stain who is a sports medicine physician if needed.   PDMP not reviewed this encounter. Orders Placed This Encounter  Procedures   [place] LIMITED JOINT SPACE STRUCTURES LOW LEFT(NO LINKED CHARGES)    Standing Status:   Future    Number of Occurrences:   1    Standing Expiration Date:   05/16/2021    Order Specific Question:   Reason for Exam (SYMPTOM  OR DIAGNOSIS REQUIRED)    Answer:   left foot pain    Order Specific Question:  Preferred imaging location?    Answer:   [facility]   No orders of the defined types were placed in this encounter.   Discussed warning signs or symptoms. Please see discharge instructions. Patient expresses understanding.   The above documentation has been reviewed and is accurate and complete Julie Ibarra, M.D.

## 2020-11-13 ENCOUNTER — Ambulatory Visit: Payer: Self-pay

## 2020-11-13 ENCOUNTER — Other Ambulatory Visit: Payer: Self-pay

## 2020-11-13 ENCOUNTER — Encounter: Payer: Self-pay | Admitting: Family Medicine

## 2020-11-13 ENCOUNTER — Ambulatory Visit (INDEPENDENT_AMBULATORY_CARE_PROVIDER_SITE_OTHER): Payer: BC Managed Care – PPO | Admitting: Family Medicine

## 2020-11-13 VITALS — BP 112/78 | HR 76 | Ht 68.0 in | Wt 189.8 lb

## 2020-11-13 DIAGNOSIS — M7672 Peroneal tendinitis, left leg: Secondary | ICD-10-CM | POA: Diagnosis not present

## 2020-11-13 DIAGNOSIS — M79672 Pain in left foot: Secondary | ICD-10-CM

## 2020-11-13 NOTE — Patient Instructions (Addendum)
Thank you for coming in today.   You could use a cam walker boot if needed.   Please complete the exercises that the athletic trainer went over with you:  View at [email or web address] using code: [email or web address]  Please use Voltaren gel (Generic Diclofenac Gel) up to 4x daily for pain as needed.  This is available over-the-counter as both the name brand Voltaren gel and the generic diclofenac gel.   I recommend you obtained a compression sleeve to help with your joint problems. There are many options on the market however I recommend obtaining a full ankle Body Helix compression sleeve.  You can find information (including how to appropriate measure yourself for sizing) can be found at www.Body [employer].ch.  Many of these products are health savings account (HSA) eligible.   You can use the compression sleeve at any time throughout the day but is most important to use while being active as well as for 2 hours post-activity.   It is appropriate to ice following activity with the compression sleeve in place.    Rest a bit prior to the MS walk event.  Ok to do the walk if you feel ok.   Recheck with me prior to the even if needed.   Dr T is also a sports medicine doctor and could certainly take over this if he is more convent.

## 2020-11-14 DIAGNOSIS — F411 Generalized anxiety disorder: Secondary | ICD-10-CM | POA: Diagnosis not present

## 2020-11-19 ENCOUNTER — Ambulatory Visit: Payer: Self-pay | Admitting: Sports Medicine

## 2020-12-12 DIAGNOSIS — F411 Generalized anxiety disorder: Secondary | ICD-10-CM | POA: Diagnosis not present

## 2020-12-16 DIAGNOSIS — M25512 Pain in left shoulder: Secondary | ICD-10-CM | POA: Diagnosis not present

## 2021-01-01 DIAGNOSIS — M25512 Pain in left shoulder: Secondary | ICD-10-CM | POA: Diagnosis not present

## 2021-01-10 DIAGNOSIS — Z1231 Encounter for screening mammogram for malignant neoplasm of breast: Secondary | ICD-10-CM | POA: Diagnosis not present

## 2021-01-10 DIAGNOSIS — R928 Other abnormal and inconclusive findings on diagnostic imaging of breast: Secondary | ICD-10-CM | POA: Diagnosis not present

## 2021-01-15 DIAGNOSIS — M25512 Pain in left shoulder: Secondary | ICD-10-CM | POA: Diagnosis not present

## 2021-01-16 DIAGNOSIS — F411 Generalized anxiety disorder: Secondary | ICD-10-CM | POA: Diagnosis not present

## 2021-01-26 DIAGNOSIS — G35 Multiple sclerosis: Secondary | ICD-10-CM | POA: Diagnosis not present

## 2021-01-26 DIAGNOSIS — Z79899 Other long term (current) drug therapy: Secondary | ICD-10-CM | POA: Diagnosis not present

## 2021-01-28 DIAGNOSIS — M25512 Pain in left shoulder: Secondary | ICD-10-CM | POA: Diagnosis not present

## 2021-02-10 DIAGNOSIS — M50223 Other cervical disc displacement at C6-C7 level: Secondary | ICD-10-CM | POA: Diagnosis not present

## 2021-02-10 DIAGNOSIS — G379 Demyelinating disease of central nervous system, unspecified: Secondary | ICD-10-CM | POA: Diagnosis not present

## 2021-02-10 DIAGNOSIS — M5021 Other cervical disc displacement,  high cervical region: Secondary | ICD-10-CM | POA: Diagnosis not present

## 2021-02-10 DIAGNOSIS — G35 Multiple sclerosis: Secondary | ICD-10-CM | POA: Diagnosis not present

## 2021-02-10 DIAGNOSIS — M4802 Spinal stenosis, cervical region: Secondary | ICD-10-CM | POA: Diagnosis not present

## 2021-02-11 DIAGNOSIS — M25512 Pain in left shoulder: Secondary | ICD-10-CM | POA: Diagnosis not present

## 2021-02-24 ENCOUNTER — Ambulatory Visit (INDEPENDENT_AMBULATORY_CARE_PROVIDER_SITE_OTHER): Payer: BC Managed Care – PPO | Admitting: Sports Medicine

## 2021-02-24 ENCOUNTER — Other Ambulatory Visit: Payer: Self-pay

## 2021-02-24 ENCOUNTER — Ambulatory Visit (INDEPENDENT_AMBULATORY_CARE_PROVIDER_SITE_OTHER): Payer: BC Managed Care – PPO

## 2021-02-24 DIAGNOSIS — L989 Disorder of the skin and subcutaneous tissue, unspecified: Secondary | ICD-10-CM

## 2021-02-24 DIAGNOSIS — M545 Low back pain, unspecified: Secondary | ICD-10-CM | POA: Diagnosis not present

## 2021-02-24 DIAGNOSIS — M5416 Radiculopathy, lumbar region: Secondary | ICD-10-CM

## 2021-02-24 DIAGNOSIS — M549 Dorsalgia, unspecified: Secondary | ICD-10-CM | POA: Diagnosis not present

## 2021-02-24 IMAGING — DX DG LUMBAR SPINE COMPLETE 4+V
6 series · 6 of 6 positions shown · non-contrast
Comparison: Lumbar spine x-ray [DATE].

CLINICAL DATA: Low back pain.

EXAM:
LUMBAR SPINE - COMPLETE 4+ VIEW

[l-spine ap]
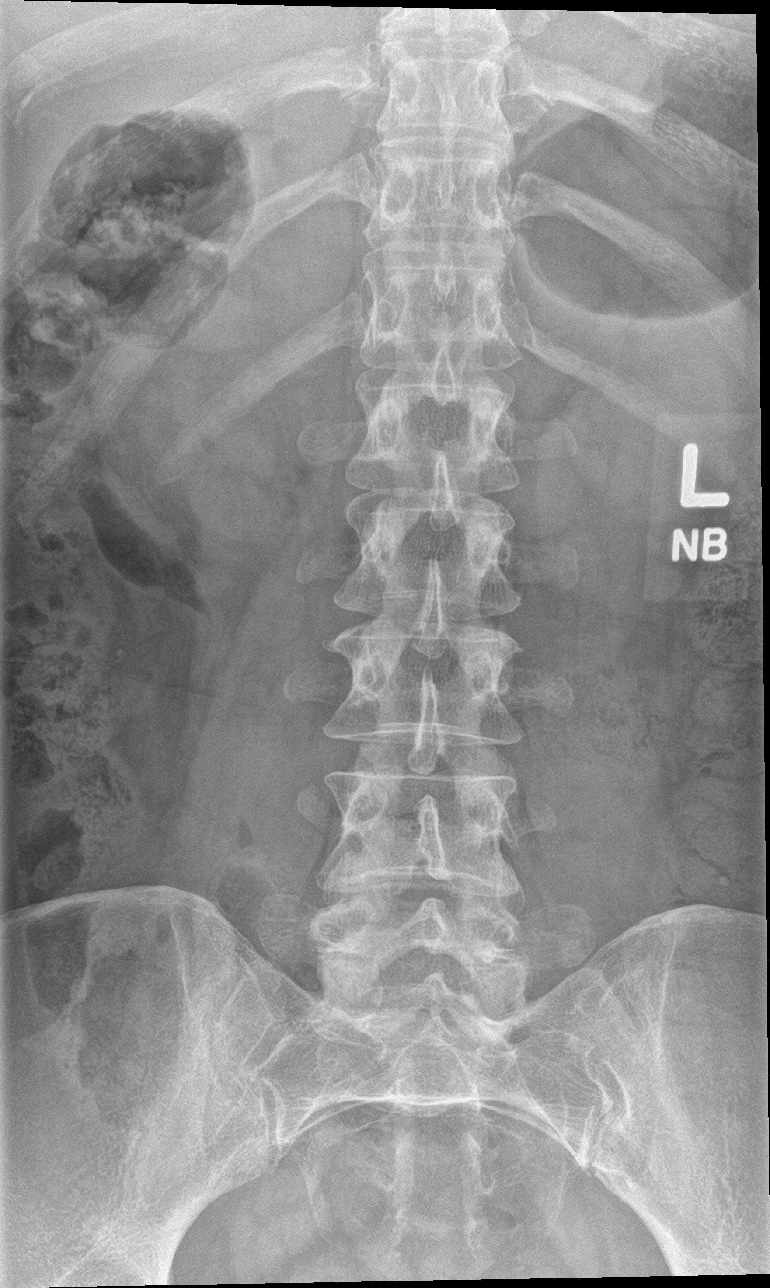

[l-spine obl (1 of 2)]
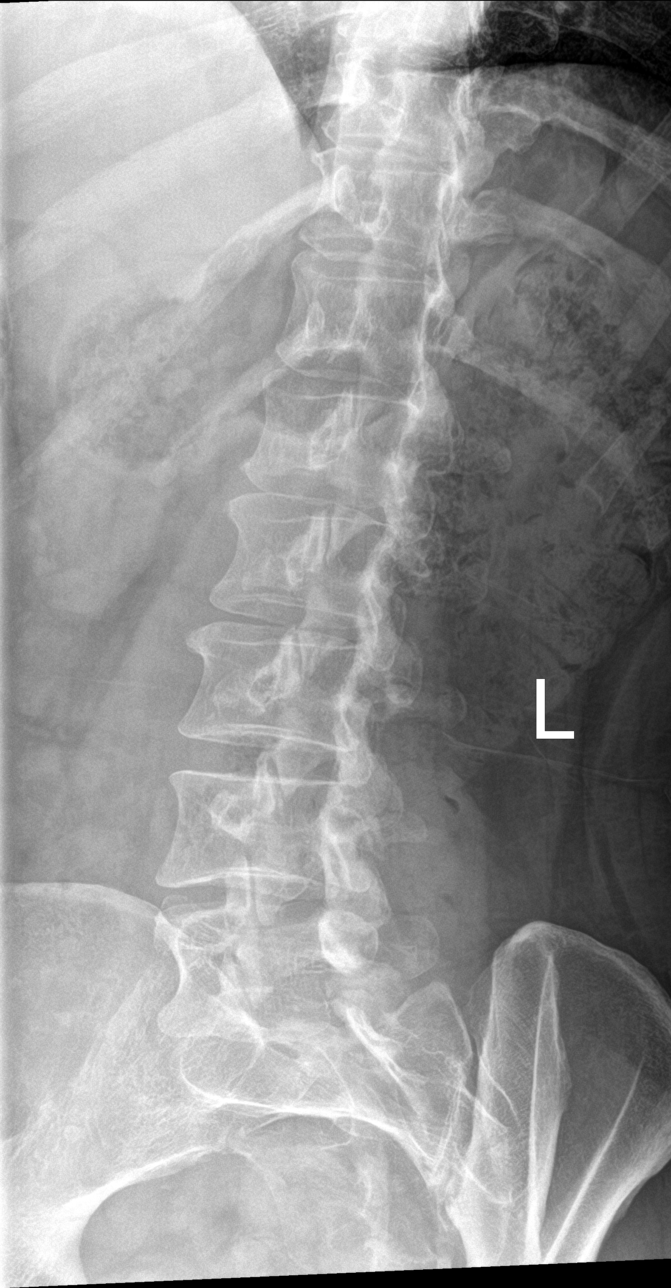

[l-spine obl (2 of 2)]
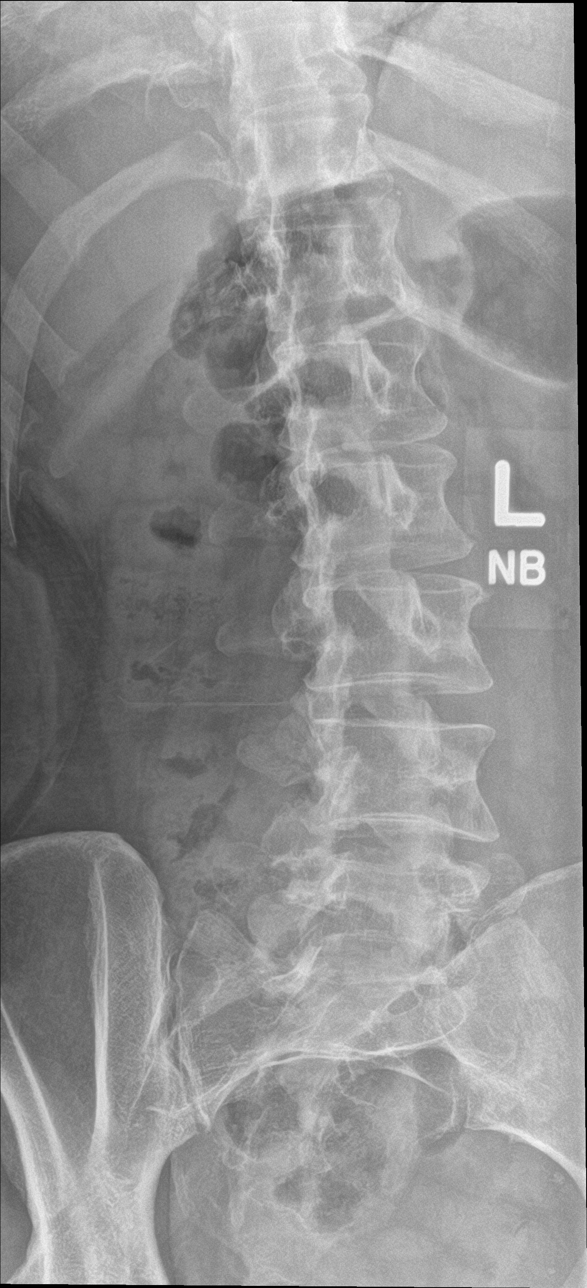

[l-spine lat (1 of 2)]
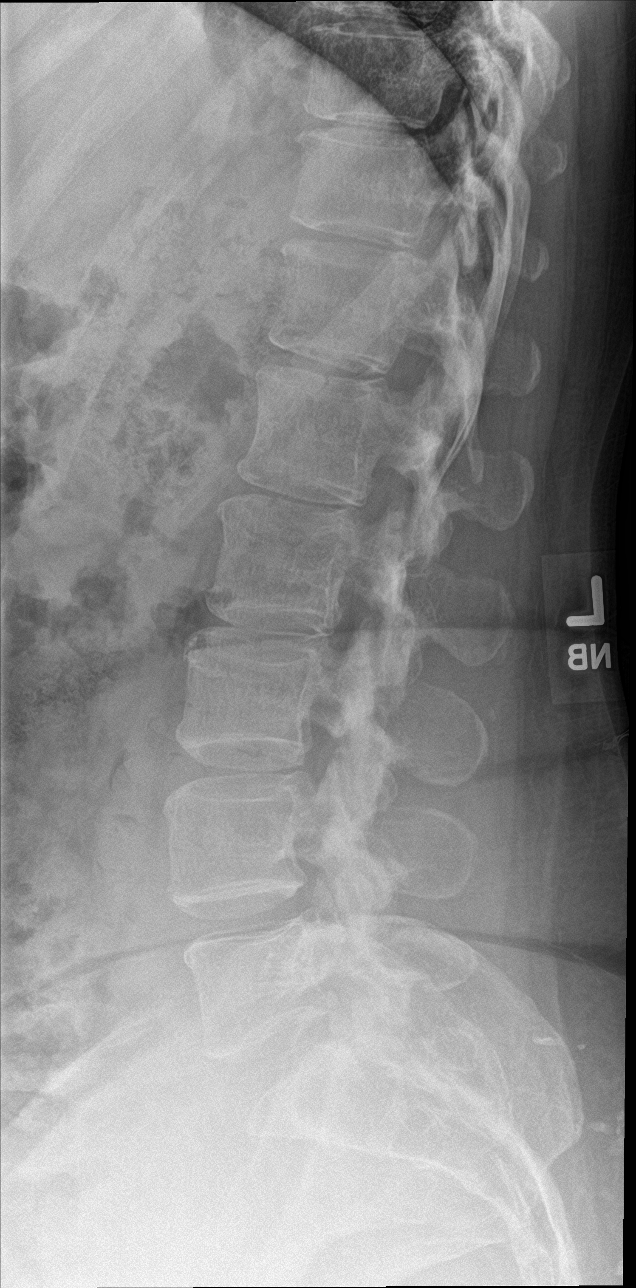

[l-spine spot]
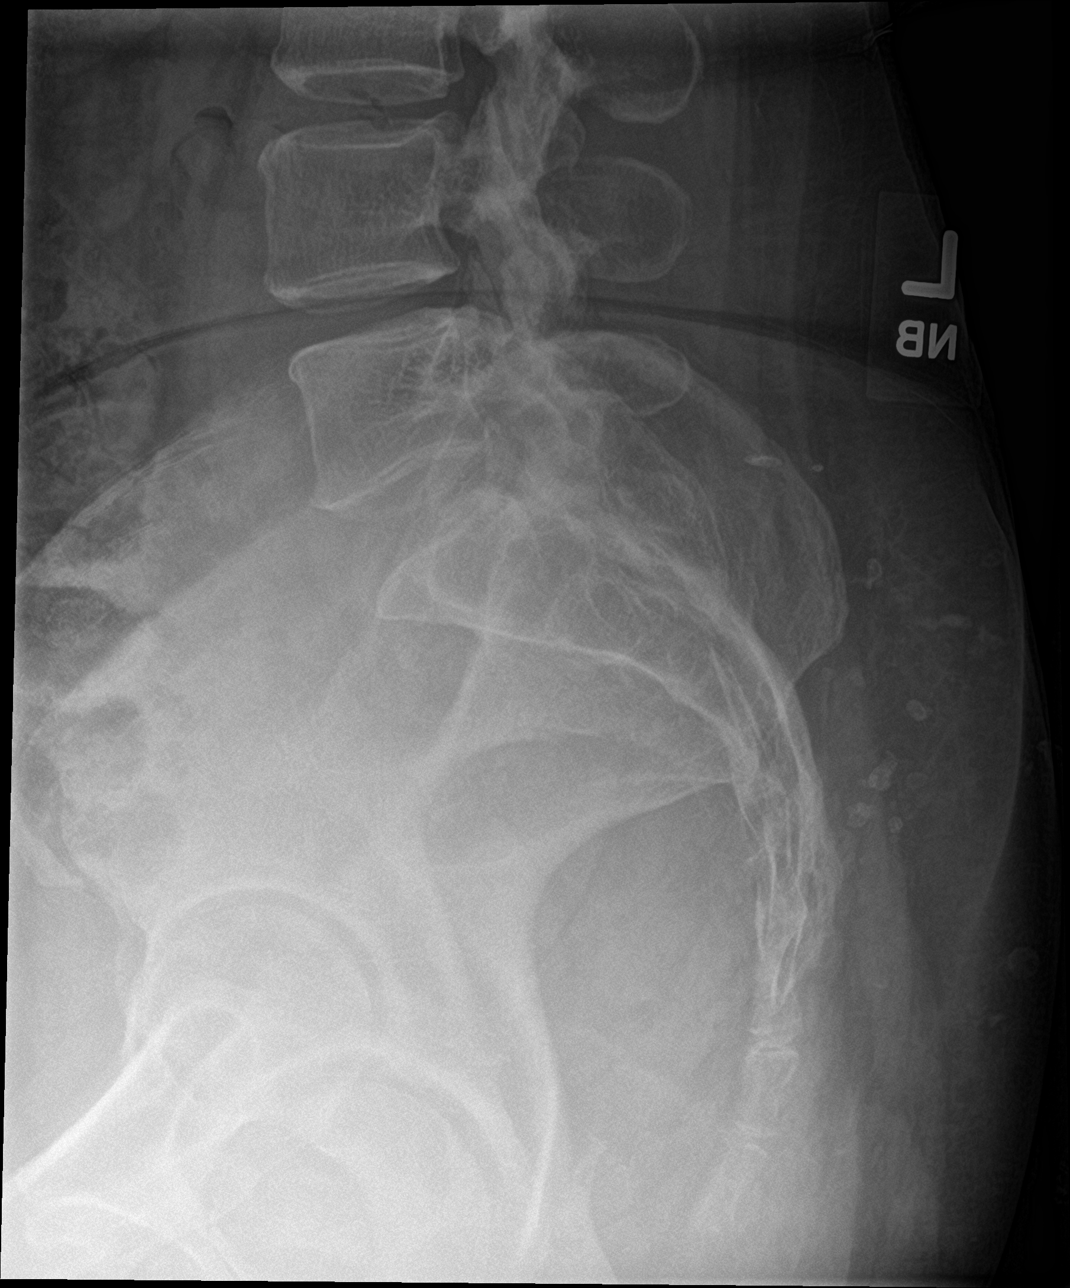

[l-spine lat (2 of 2)]
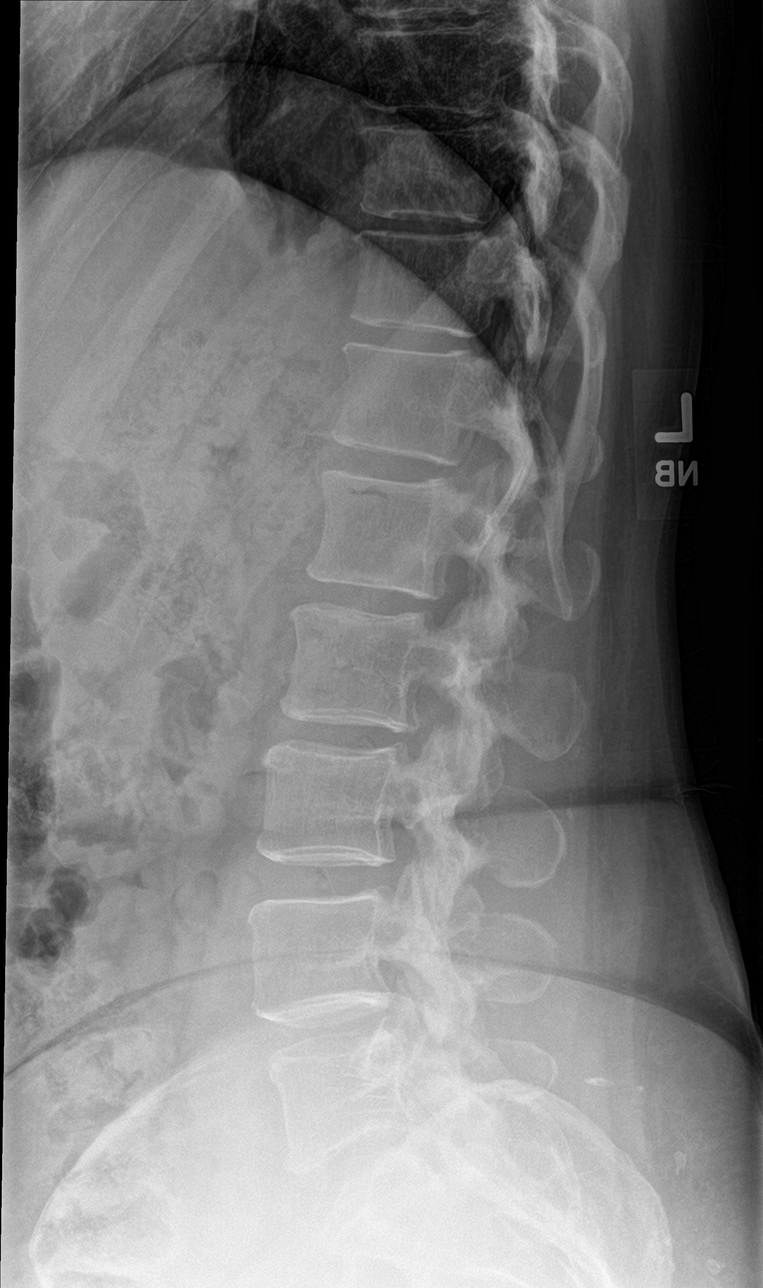

[6 of 6 positions shown; findings below may reference images not displayed]

FINDINGS: There is no evidence of lumbar spine fracture. Alignment is normal.
Intervertebral disc spaces are maintained.
IMPRESSION: Negative.

## 2021-02-24 IMAGING — DX DG THORACIC SPINE 3V
3 series · 3 of 3 positions shown · non-contrast
Comparison: None.

CLINICAL DATA: Back pain.

EXAM:
THORACIC SPINE - 3 VIEWS

[t-spine ap]
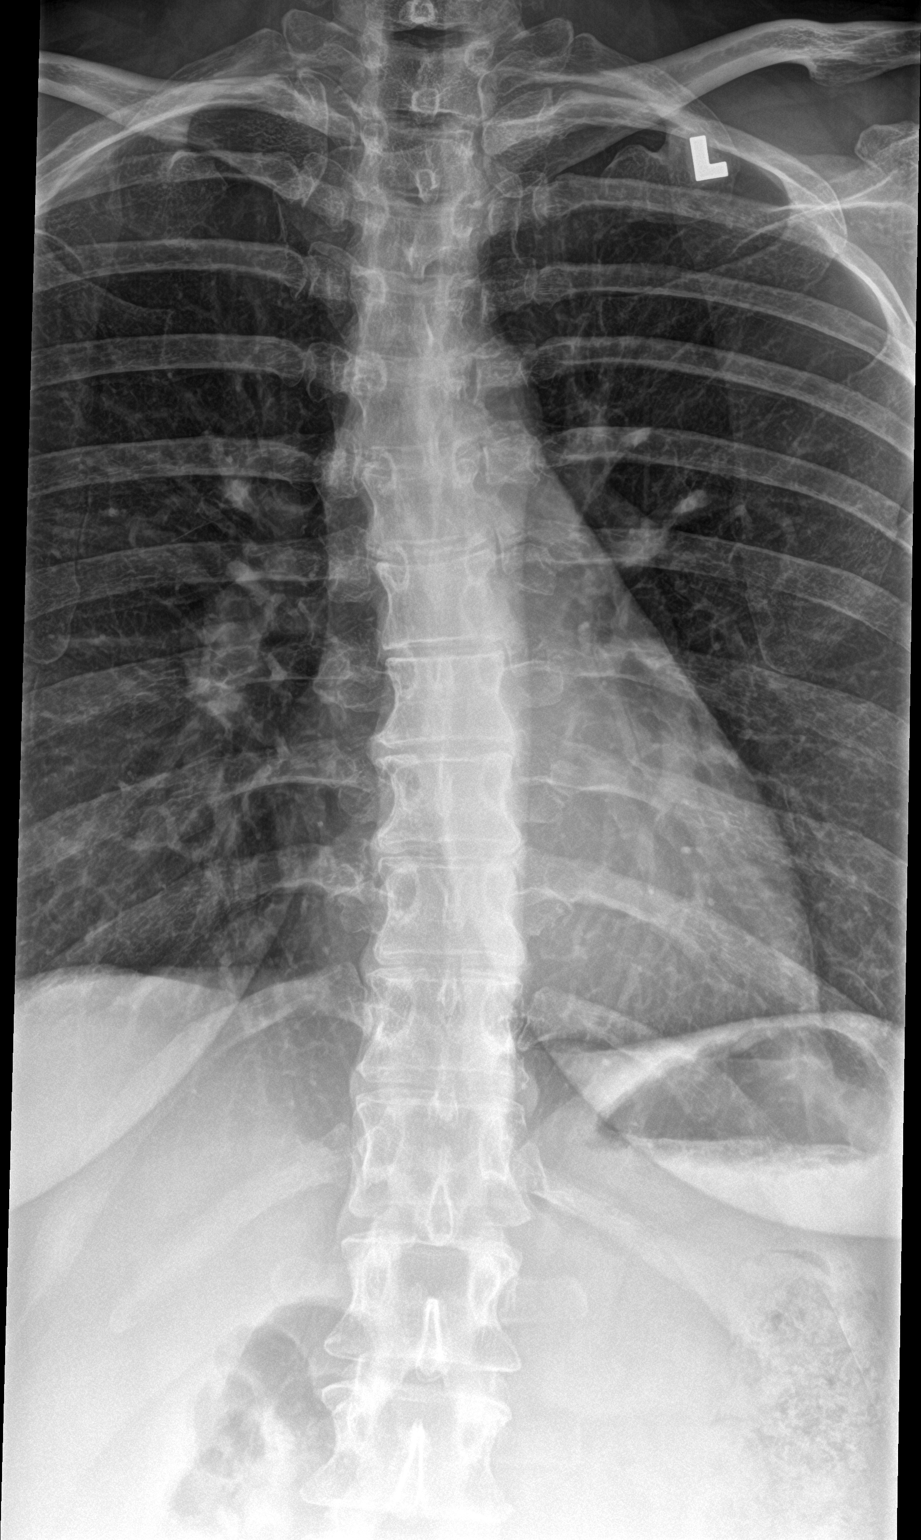

[t-spine lat]
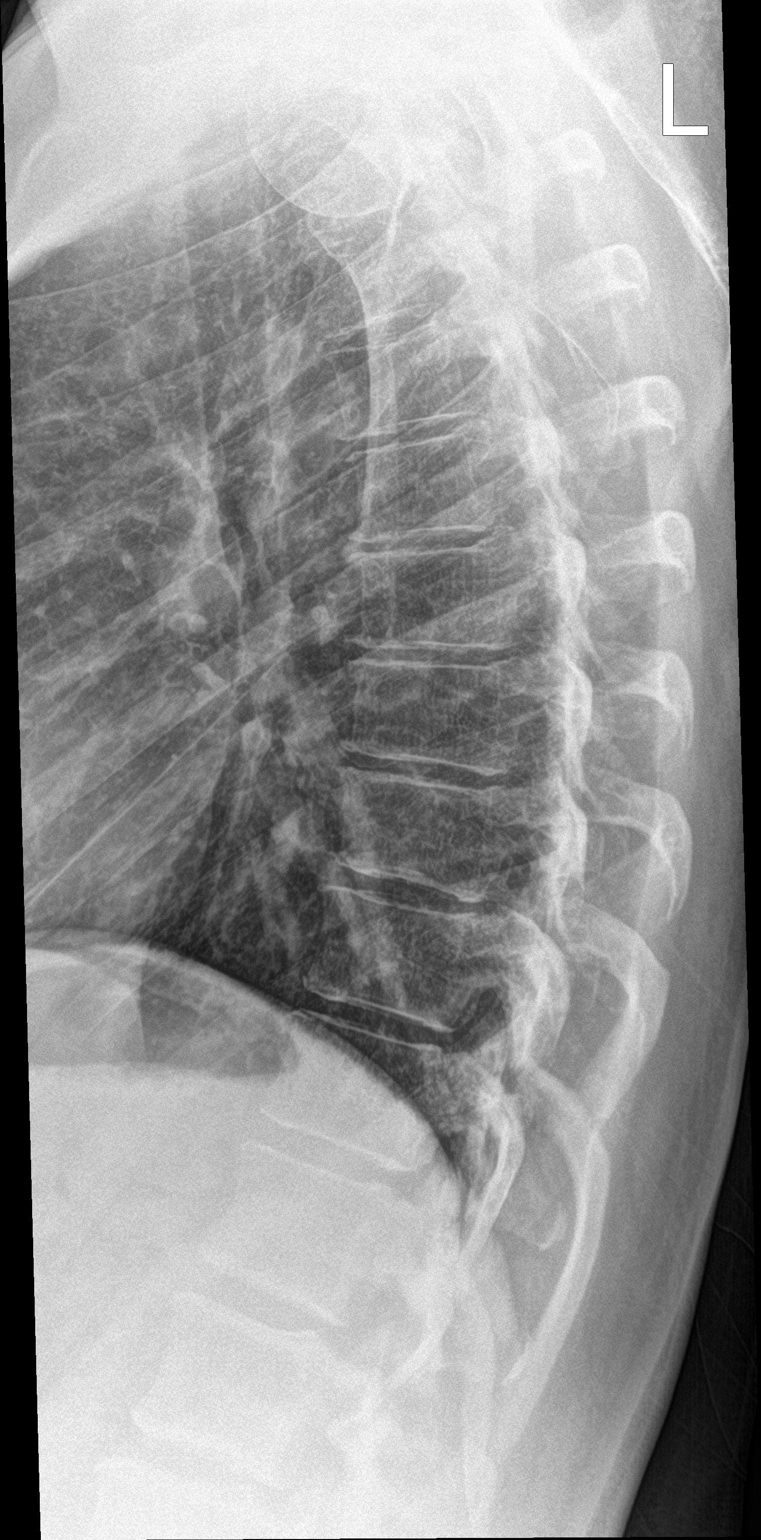

[t-spine swimmers]
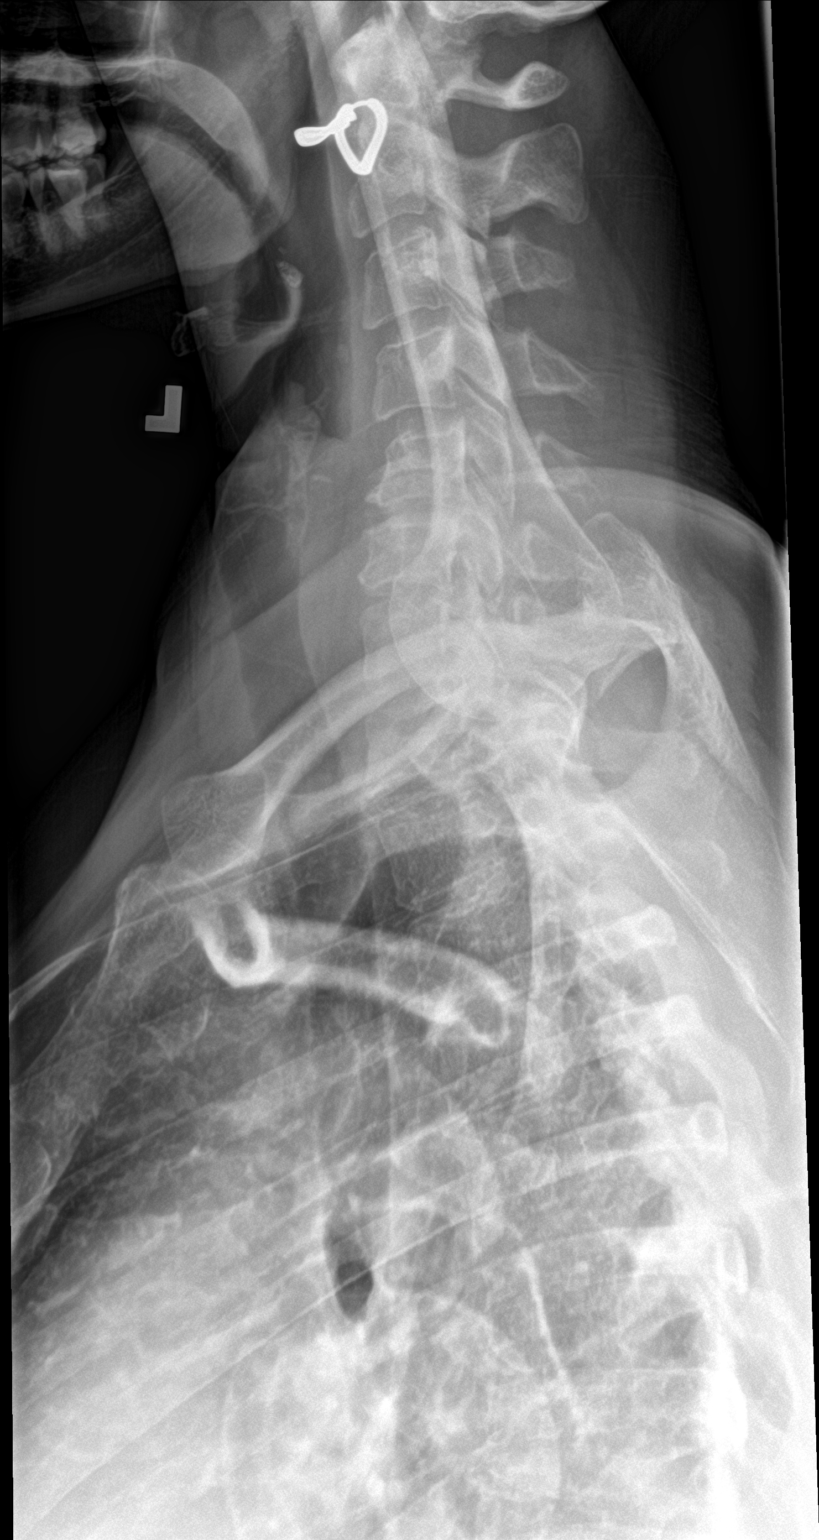

[3 of 3 positions shown; findings below may reference images not displayed]

FINDINGS: There is trace levoconvex curvature of the midthoracic spine. There
is no evidence of thoracic spine fracture. Alignment is normal. No
other significant bone abnormalities are identified.
IMPRESSION: Negative.

## 2021-02-24 NOTE — Assessment & Plan Note (Signed)
This pleasant 45 year old female with history of multiple sclerosis and recent imaging of her brain and cervical spine that only showed brain T2 hyperintensities, no cervical spinal cord lesions, has been having increasing low back pain with numbness and tingling, weakness in both legs anteriorly above the knees. Considering her MS we need to ensure no thoracic myelitis, or degenerative processes resulting in her progressive weakness. Adding x-rays of her thoracolumbar spine, as well as thoracolumbar MRI with and without IV contrast to evaluate for MS in the spinal cord.

## 2021-02-24 NOTE — Progress Notes (Signed)
    Procedures performed today:    Procedure:  Cryodestruction of right upper chest skin lesion Consent obtained and verified. Time-out conducted. Noted no overlying erythema, induration, or other signs of local infection. Completed without difficulty using Cryo-Gun. Advised to call if fevers/chills, erythema, induration, drainage, or persistent bleeding.  Independent interpretation of notes and tests performed by another provider:   None.  Brief History, Exam, Impression, and Recommendations:    Lumbar radiculitis This pleasant 45 year old female with history of multiple sclerosis and recent imaging of her brain and cervical spine that only showed brain T2 hyperintensities, no cervical spinal cord lesions, has been having increasing low back pain with numbness and tingling, weakness in both legs anteriorly above the knees. Considering her MS we need to ensure no thoracic myelitis, or degenerative processes resulting in her progressive weakness. Adding x-rays of her thoracolumbar spine, as well as thoracolumbar MRI with and without IV contrast to evaluate for MS in the spinal cord.   Skin lesion of chest wall Present for several weeks, appears to be an SK, cryotherapy as above, return to see me if not significantly better in a month and we will consider either repeat cryotherapy or biopsy.    ___________________________________________ Ihor Austin. Benjamin Stain, M.D., ABFM., CAQSM. Primary Care and Sports Medicine [facility]  Adjunct Instructor of Family Medicine  [employer]

## 2021-02-24 NOTE — Assessment & Plan Note (Signed)
Present for several weeks, appears to be an SK, cryotherapy as above, return to see me if not significantly better in a month and we will consider either repeat cryotherapy or biopsy.

## 2021-02-25 DIAGNOSIS — M25512 Pain in left shoulder: Secondary | ICD-10-CM | POA: Diagnosis not present

## 2021-03-09 ENCOUNTER — Ambulatory Visit (INDEPENDENT_AMBULATORY_CARE_PROVIDER_SITE_OTHER): Payer: BC Managed Care – PPO

## 2021-03-09 ENCOUNTER — Other Ambulatory Visit: Payer: Self-pay

## 2021-03-09 DIAGNOSIS — M48061 Spinal stenosis, lumbar region without neurogenic claudication: Secondary | ICD-10-CM | POA: Diagnosis not present

## 2021-03-09 DIAGNOSIS — M5416 Radiculopathy, lumbar region: Secondary | ICD-10-CM | POA: Diagnosis not present

## 2021-03-09 DIAGNOSIS — M4724 Other spondylosis with radiculopathy, thoracic region: Secondary | ICD-10-CM | POA: Diagnosis not present

## 2021-03-09 DIAGNOSIS — M5114 Intervertebral disc disorders with radiculopathy, thoracic region: Secondary | ICD-10-CM | POA: Diagnosis not present

## 2021-03-09 DIAGNOSIS — G35 Multiple sclerosis: Secondary | ICD-10-CM | POA: Diagnosis not present

## 2021-03-09 DIAGNOSIS — M419 Scoliosis, unspecified: Secondary | ICD-10-CM | POA: Diagnosis not present

## 2021-03-09 DIAGNOSIS — M5116 Intervertebral disc disorders with radiculopathy, lumbar region: Secondary | ICD-10-CM | POA: Diagnosis not present

## 2021-03-09 IMAGING — MR MR LUMBAR SPINE WO/W CM
5 of 7 series · 29 of 48 positions shown · IV contrast (GADAVIST)
Comparison: Lumbar spine radiographs [DATE]

CLINICAL DATA: Lumbar radiculitis. Bilateral lower extremity
weakness. History of multiple sclerosis.

EXAM:
MRI LUMBAR SPINE WITHOUT AND WITH CONTRAST
TECHNIQUE: Multiplanar and multiecho pulse sequences of the lumbar spine were
obtained without and with intravenous contrast.
CONTRAST:  9mL GADAVIST GADOBUTROL 1 MMOL/ML IV SOLN

[Series 3: T1 · sagittal · 4.0mm · 0.51mm/px · 4 of 15 slices shown (1 of 2)]
[im 1/15]
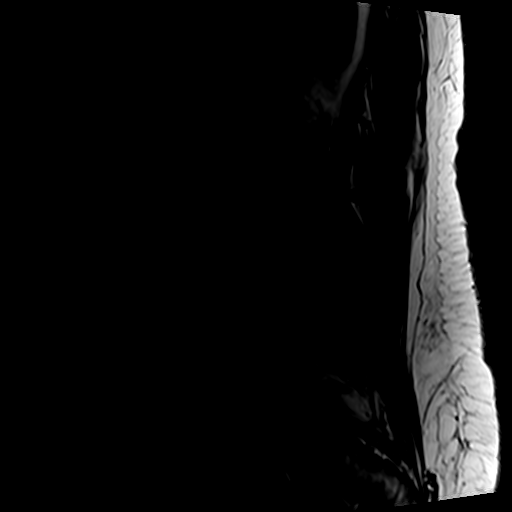
[im 5/15]
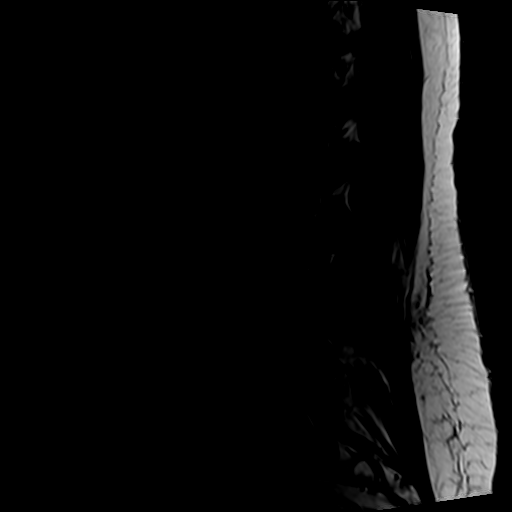
[im 10/15]
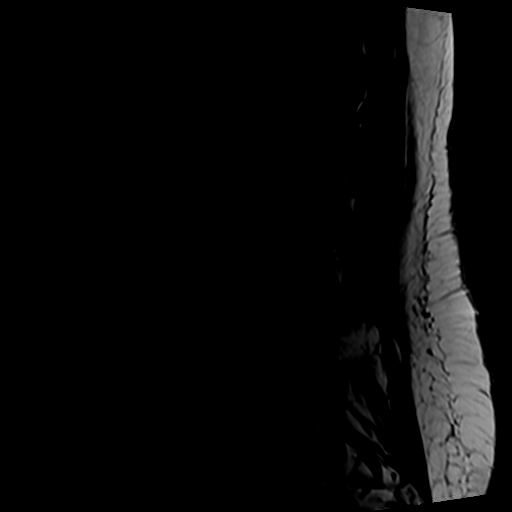
[im 15/15]
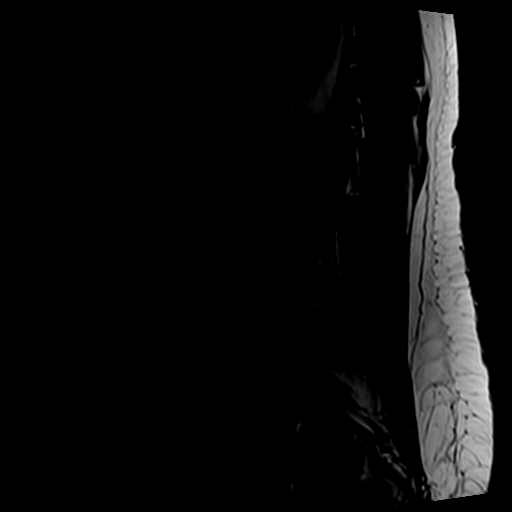

[Series 4: T2 · axial · 4.0mm · 0.78mm/px · z∈[-539,-321]mm · 11 of 39 slices shown (1 of 2)]
[im 1/39]
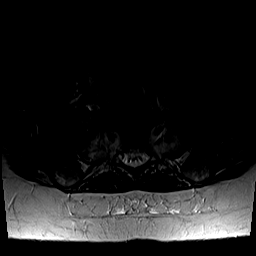
[im 4/39]
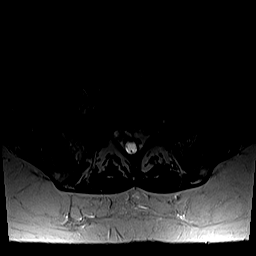
[im 8/39]
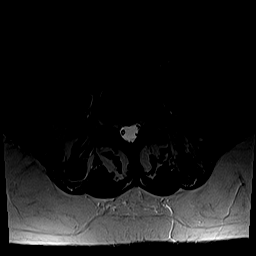
[im 12/39]
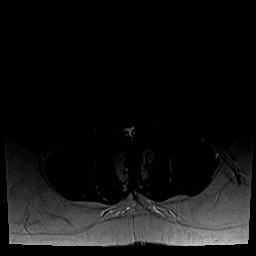
[im 16/39]
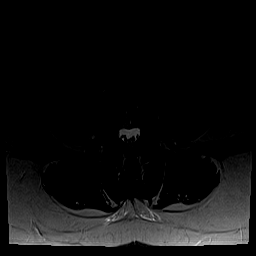
[im 20/39]
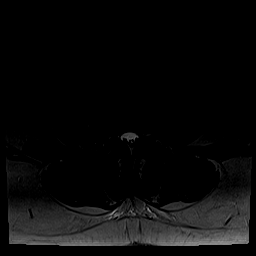
[im 23/39]
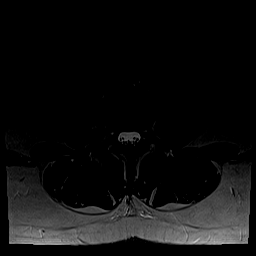
[im 27/39]
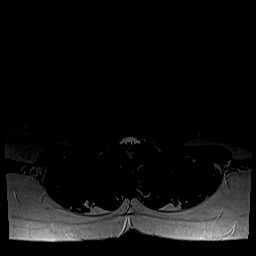
[im 31/39]
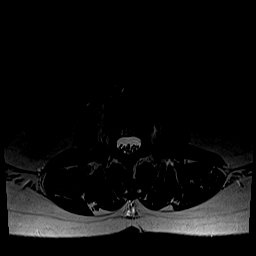
[im 35/39]
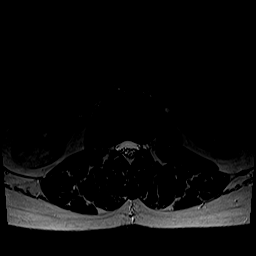
[im 39/39]
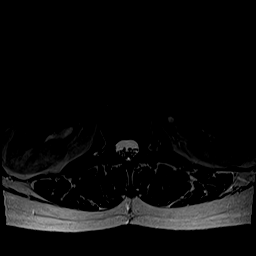

[Series 5: T1 · axial · 4.0mm · 0.39mm/px · z∈[-539,-321]mm · 8 of 37 slices shown (2 of 2)]
[im 1/37]
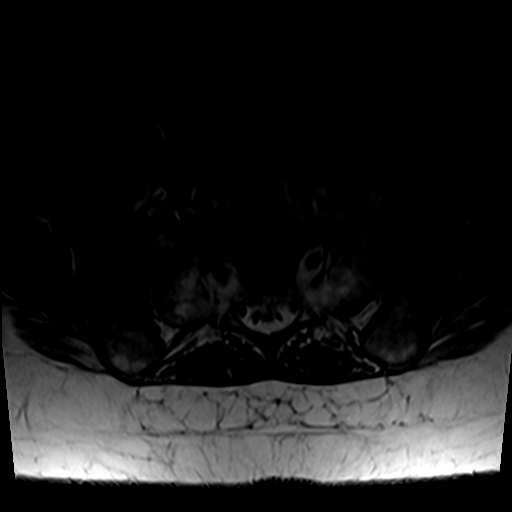
[im 5/37]
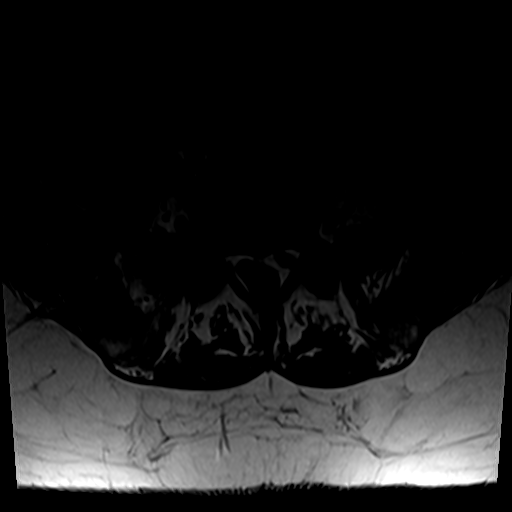
[im 13/37]
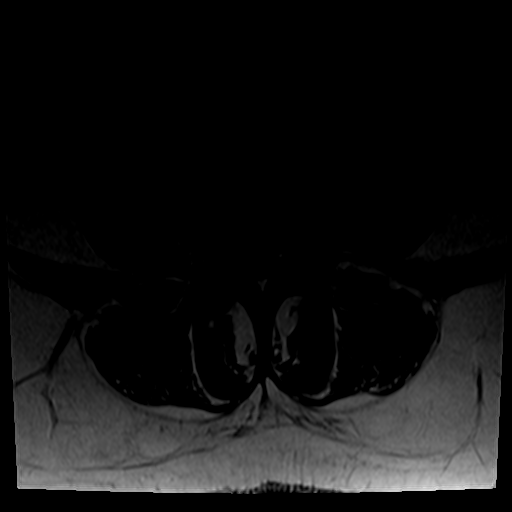
[im 17/37]
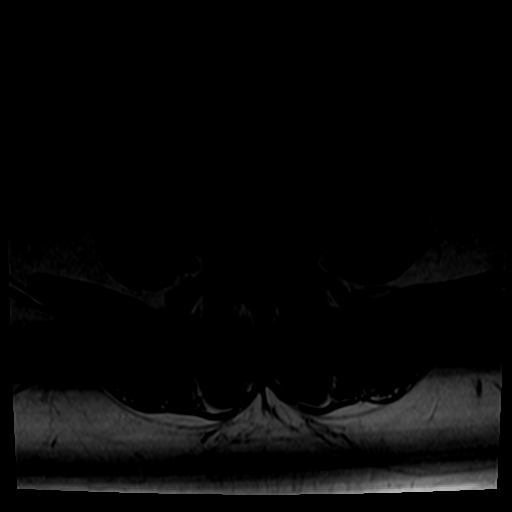
[im 21/37]
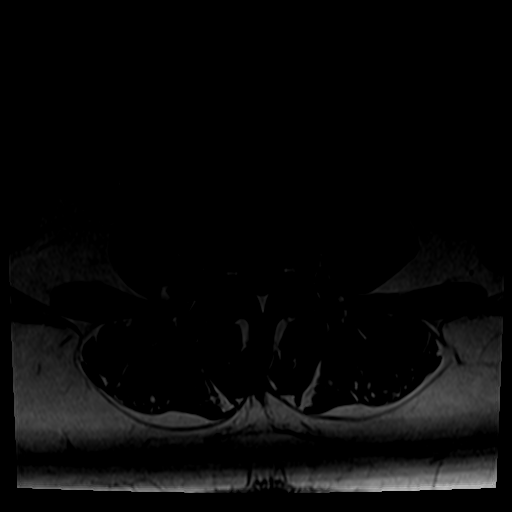
[im 25/37]
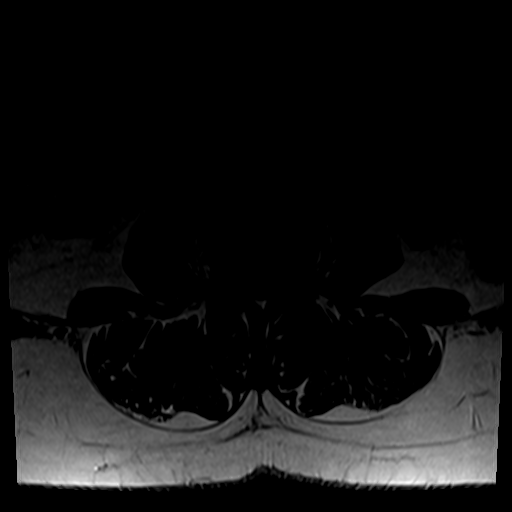
[im 33/37]
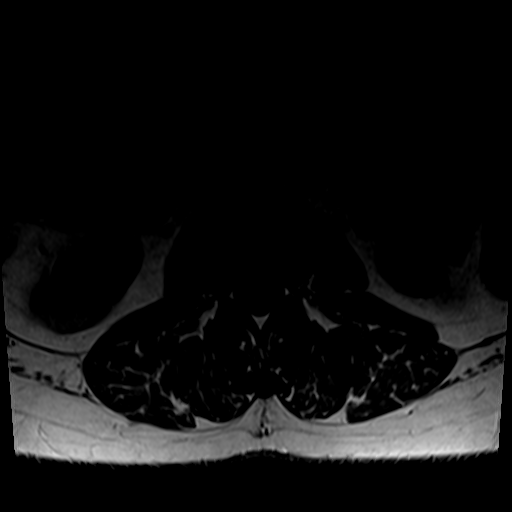
[im 37/37]
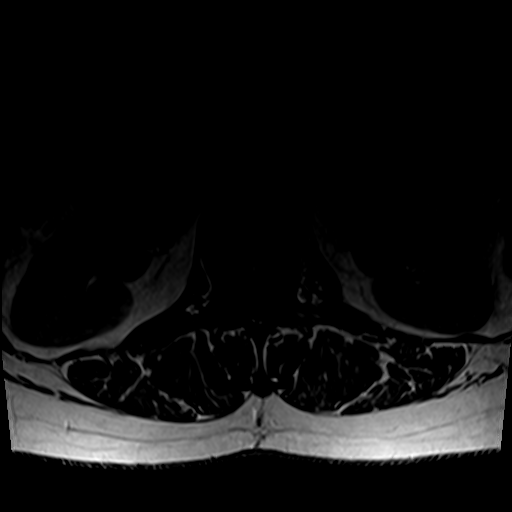

[Series 6: T2 · sagittal · 4.0mm · 1.02mm/px · 4 of 14 slices shown (2 of 2)]
[im 1/14]
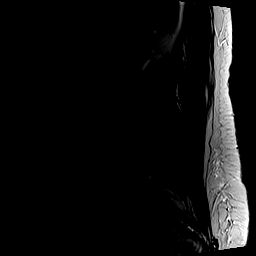
[im 5/14]
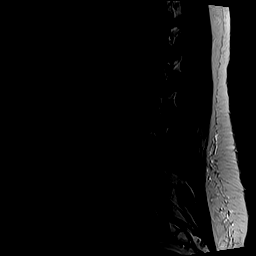
[im 9/14]
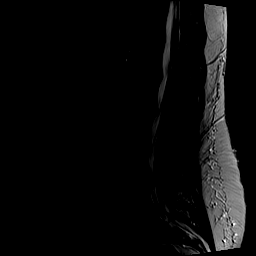
[im 14/14]
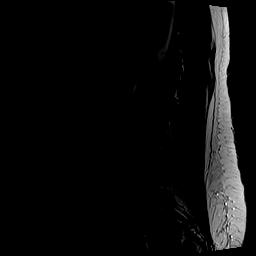

[Series 7: T1 fat-sat post-contrast · sagittal · 4.0mm · 0.51mm/px · 2 of 15 slices shown]
[im 1/15]
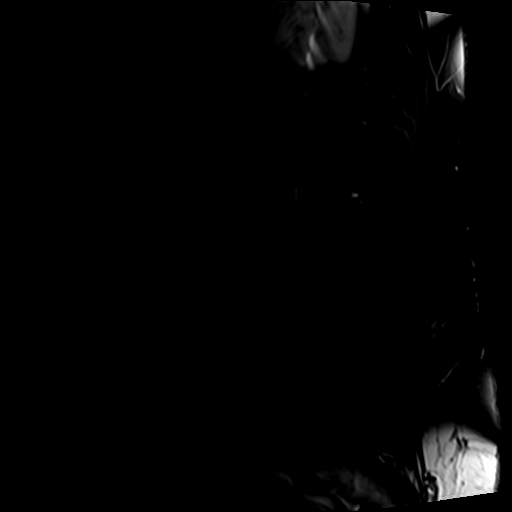
[im 5/15]
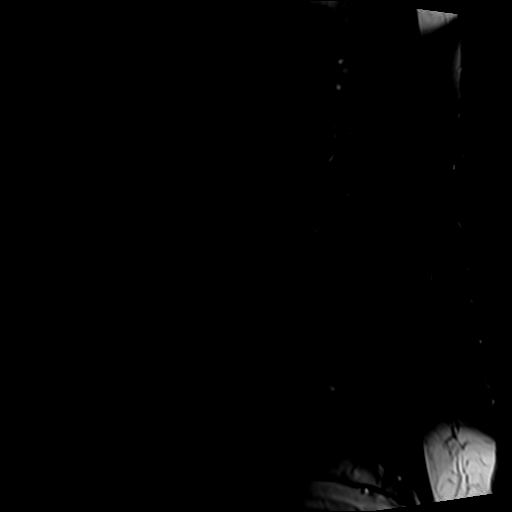

[29 of 48 positions shown; findings below may reference images not displayed]

FINDINGS: Segmentation:  Standard.

Alignment:  Normal.

Vertebrae: No fracture, suspicious marrow lesion, or significant
marrow edema.

Conus medullaris and cauda equina: Conus extends to the L1 level.
Conus and cauda equina appear normal. Distal thoracic spinal cord
signal abnormality is more fully evaluated on the separate thoracic
spine MRI.

Paraspinal and other soft tissues: Unremarkable.

Disc levels:

Disc desiccation and at most mild disc space narrowing at L1-2,
L2-3, and L4-5.

L1-2: Minimal disc bulging and a right foraminal to extraforaminal
disc protrusion result in borderline right neural foraminal stenosis
without spinal stenosis. The disc protrusion may affect the
extraforaminal right L1 nerve.

L2-3: Circumferential disc bulging eccentric to the right and mild
facet hypertrophy result in mild right neural foraminal stenosis
with the disc potentially affecting the extraforaminal right L2
nerve. No significant spinal stenosis.

L3-4: Disc bulging and mild facet and ligamentum flavum hypertrophy
result in borderline to mild bilateral lateral recess stenosis and
borderline bilateral neural foraminal stenosis without significant
spinal stenosis.

L4-5: Circumferential disc bulging and mild-to-moderate facet and
ligamentum flavum hypertrophy result in mild spinal stenosis, mild
bilateral lateral recess stenosis, and mild-to-moderate bilateral
neural foraminal stenosis.

L5-S1: Mild facet hypertrophy without disc herniation or stenosis.
IMPRESSION: Multilevel lumbar disc and facet degeneration, most notable at L4-5
where there is mild spinal stenosis and mild-to-moderate bilateral
neural foraminal stenosis.

## 2021-03-09 IMAGING — MR MR THORACIC SPINE WO/W CM
7 of 8 series · 34 of 48 positions shown · IV contrast (gadavist)
Comparison: Thoracic spine radiographs [DATE]

CLINICAL DATA: Lumbar radiculitis. Bilateral lower extremity
weakness. History of multiple sclerosis.

EXAM:
MRI THORACIC WITHOUT AND WITH CONTRAST
TECHNIQUE: Multiplanar and multiecho pulse sequences of the thoracic spine were
obtained without and with intravenous contrast.
CONTRAST:  9mL GADAVIST GADOBUTROL 1 MMOL/ML IV SOLN

[Series 5: T2 · sagittal · 3.0mm · 1.06mm/px · 4 of 17 slices shown (1 of 2)]
[im 1/17]
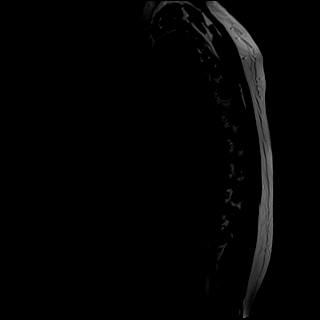
[im 6/17]
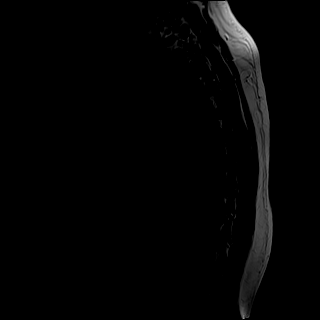
[im 11/17]
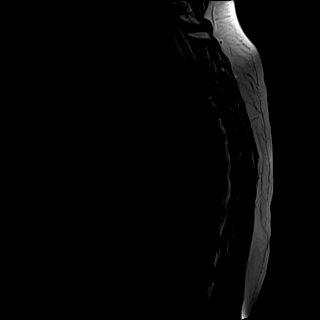
[im 17/17]
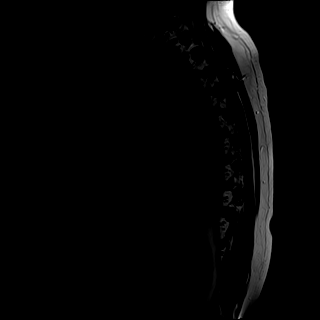

[Series 6: T1 · sagittal · 3.0mm · 1.06mm/px · 4 of 17 slices shown (1 of 2)]
[im 1/17]
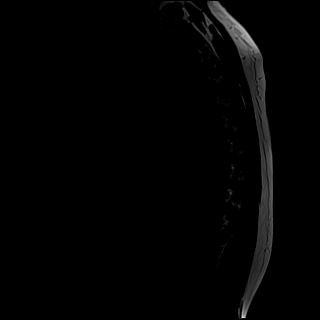
[im 6/17]
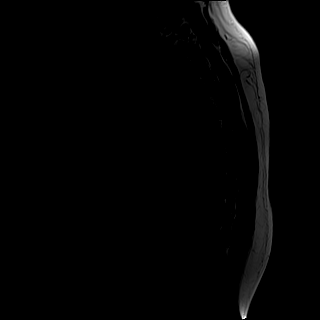
[im 11/17]
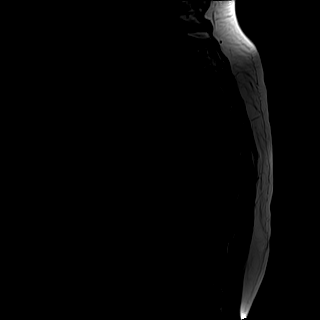
[im 17/17]
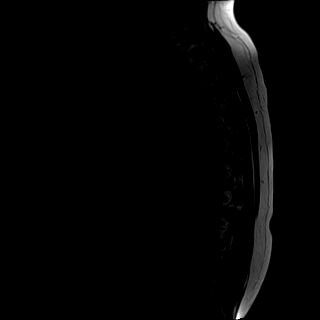

[Series 7: STIR · sagittal · 3.0mm · 1.06mm/px · 4 of 17 slices shown]
[im 1/17]
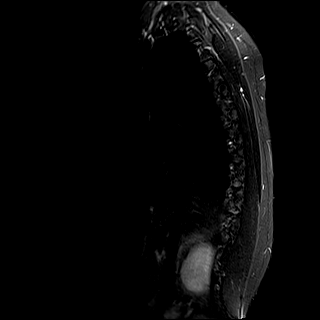
[im 6/17]
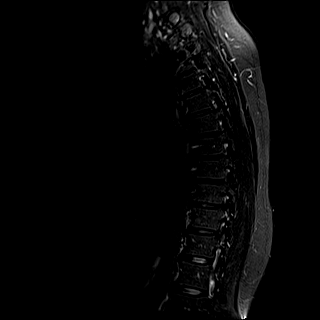
[im 11/17]
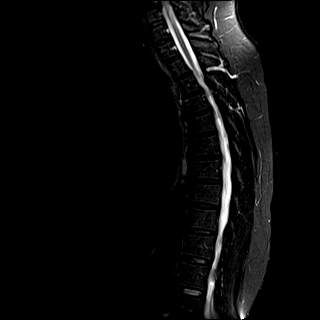
[im 17/17]
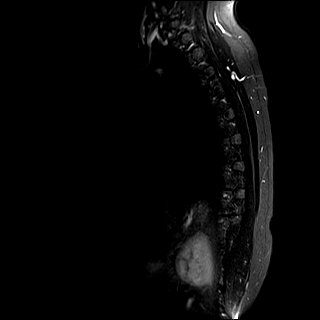

[Series 8: T2 · axial · 3.0mm · 0.78mm/px · z∈[-314,-77]mm · 8 of 39 slices shown (2 of 2)]
[im 1/39]
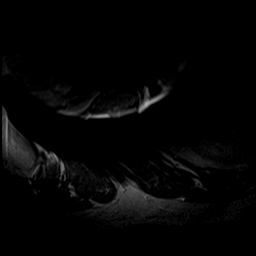
[im 6/39]
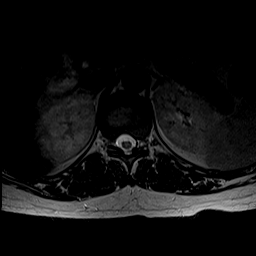
[im 11/39]
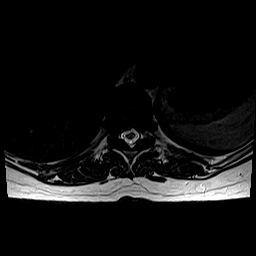
[im 17/39]
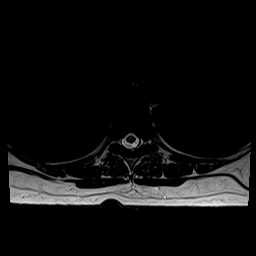
[im 22/39]
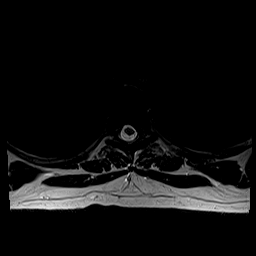
[im 28/39]
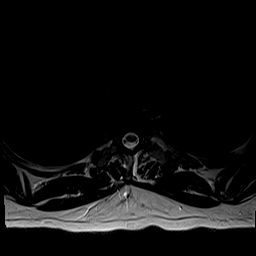
[im 33/39]
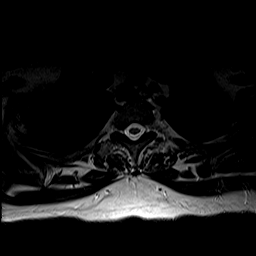
[im 39/39]
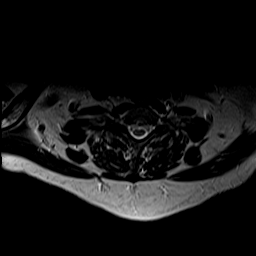

[Series 10: T1 · axial · non-contrast · 3.0mm · 0.39mm/px · z∈[-314,-77]mm · 8 of 39 slices shown (2 of 2)]
[im 1/39]
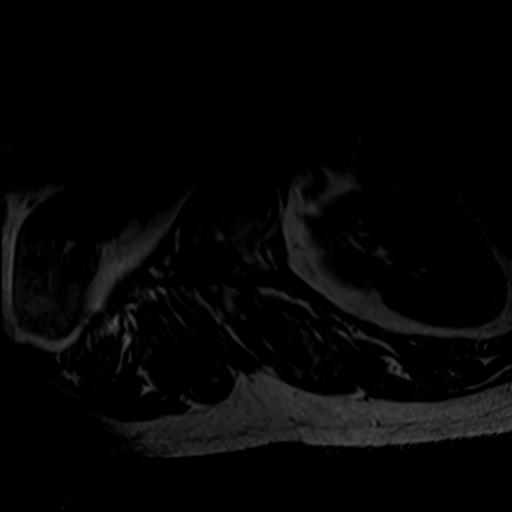
[im 6/39]
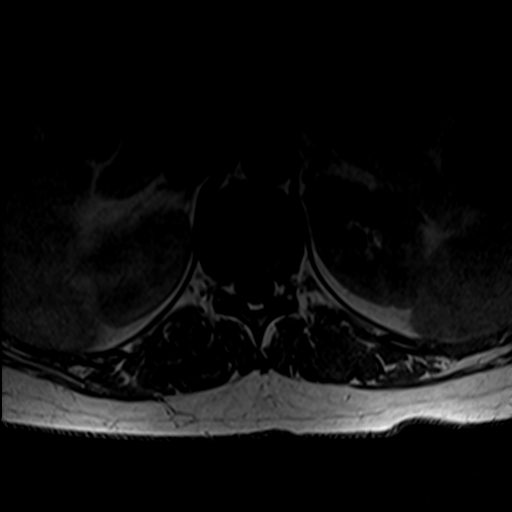
[im 11/39]
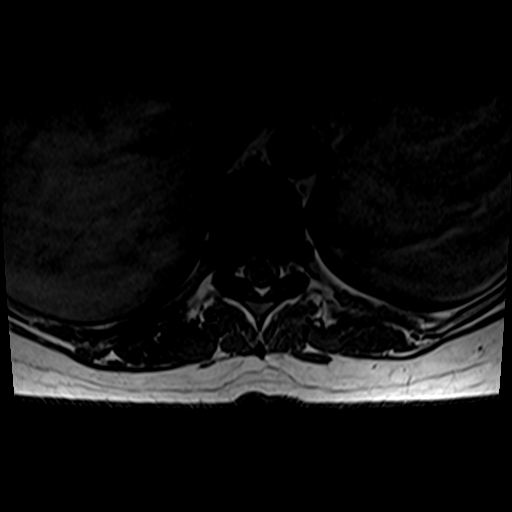
[im 17/39]
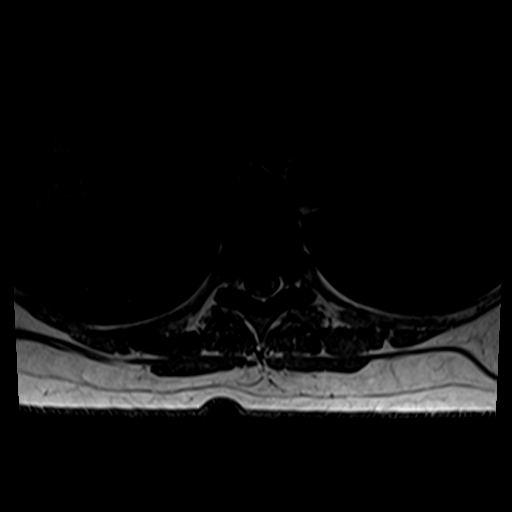
[im 22/39]
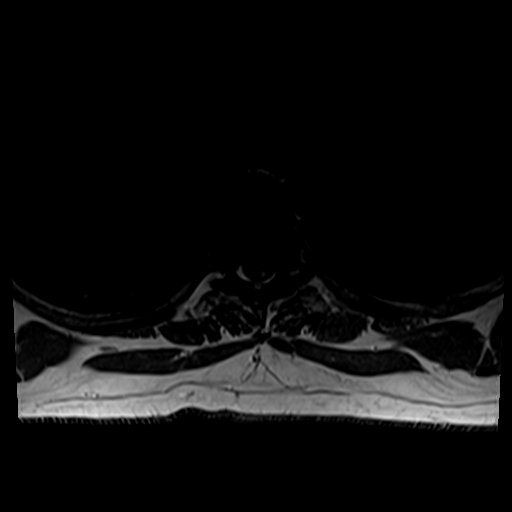
[im 28/39]
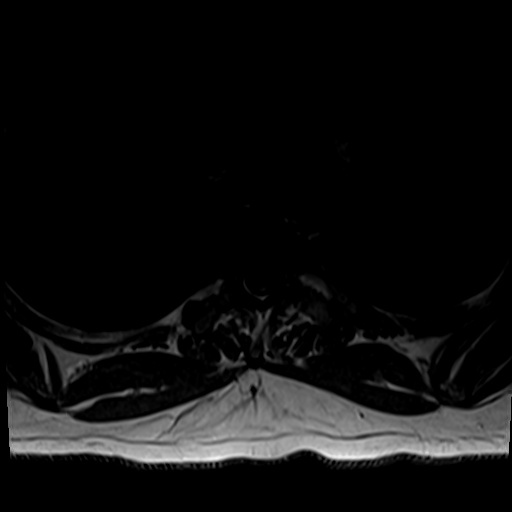
[im 33/39]
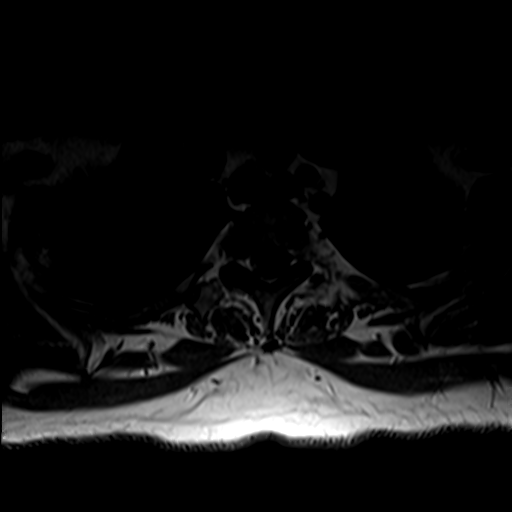
[im 39/39]
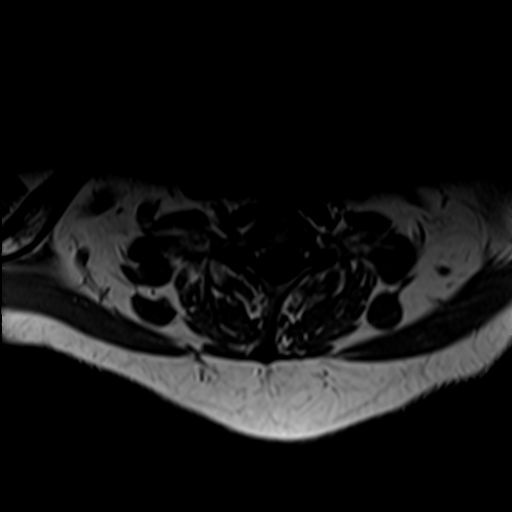

[Series 11: T1 post-contrast · sagittal · 3.0mm · 1.33mm/px · 4 of 17 slices shown (1 of 2)]
[im 1/17]
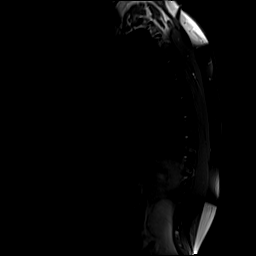
[im 6/17]
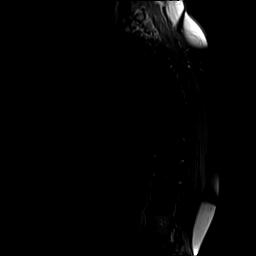
[im 11/17]
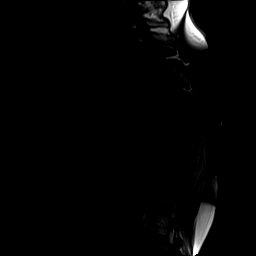
[im 17/17]
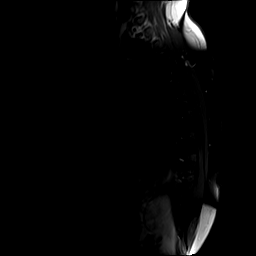

[Series 12: T1 post-contrast · axial · 3.0mm · 0.39mm/px · z∈[-318,-282]mm · 2 of 39 slices shown (2 of 2)]
[im 1/39]
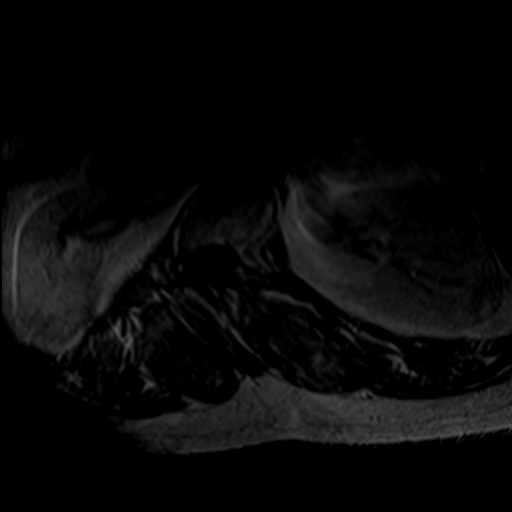
[im 6/39]
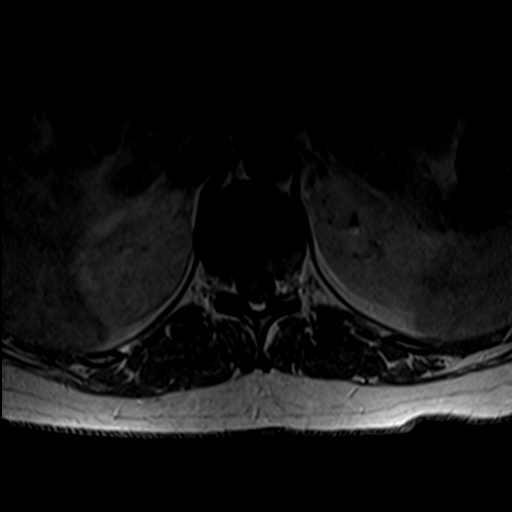

[34 of 48 positions shown; findings below may reference images not displayed]

FINDINGS: Alignment:  Mild S-shaped thoracic scoliosis.  No listhesis.

Vertebrae: No fracture, suspicious marrow lesion, or significant
marrow edema.

Cord: Abnormal T2 hyperintensity bilaterally in the spinal cord at
T11-12 with volume loss. No abnormal intradural enhancement.

Paraspinal and other soft tissues: Unremarkable.

Disc levels:

Cervical spondylosis, more fully evaluated on last month's cervical
spine MRI.

Small right paracentral disc protrusion at T9-10 resulting in slight
ventral cord flattening without spinal stenosis. Widespread moderate
thoracic facet arthrosis resulting in up to mild neural foraminal
stenosis.
IMPRESSION: 1. T2 hyperintensity and volume loss in the spinal cord at T11-12
compatible with chronic demyelinating disease.
2. Small disc protrusion at T9-10 without spinal stenosis.
3. Widespread moderate thoracic facet arthrosis.

## 2021-03-09 MED ORDER — GADOBUTROL 1 MMOL/ML IV SOLN
9.0000 mL | Freq: Once | INTRAVENOUS | Status: AC | PRN
  Administered 2021-03-09: 9 mL via INTRAVENOUS

## 2021-03-11 DIAGNOSIS — M25512 Pain in left shoulder: Secondary | ICD-10-CM | POA: Diagnosis not present

## 2021-03-25 ENCOUNTER — Ambulatory Visit: Payer: BC Managed Care – PPO | Admitting: Sports Medicine

## 2021-03-25 DIAGNOSIS — M25512 Pain in left shoulder: Secondary | ICD-10-CM | POA: Diagnosis not present

## 2021-03-27 ENCOUNTER — Ambulatory Visit (INDEPENDENT_AMBULATORY_CARE_PROVIDER_SITE_OTHER): Payer: BC Managed Care – PPO | Admitting: Sports Medicine

## 2021-03-27 ENCOUNTER — Other Ambulatory Visit: Payer: Self-pay

## 2021-03-27 DIAGNOSIS — L821 Other seborrheic keratosis: Secondary | ICD-10-CM

## 2021-03-27 DIAGNOSIS — M5416 Radiculopathy, lumbar region: Secondary | ICD-10-CM | POA: Diagnosis not present

## 2021-03-27 NOTE — Progress Notes (Signed)
° ° °  Procedures performed today:    None.  Independent interpretation of notes and tests performed by another provider:   None.  Brief History, Exam, Impression, and Recommendations:    Seborrheic keratosis Essentially resolved after cryotherapy at the last visit, return as needed for this.  Lumbar radiculitis This pleasant 45 year old female has a history of multiple sclerosis, recent imaging of the brain and cervical spine only showed brain T2 hyperintensities, no cervical cord lesions, we did an MRI of her thoracic and lumbar spine, symptomatology was predominately numbness, tingling, weakness in both legs anteriorly above the knees. She will follow-up her thoracolumbar spine MRI findings with her neurologist, lumbar spine MRI did show a broad-based L4-L5 disc protrusion likely affecting both L4 nerve roots, this can explain her symptomatology which has thankfully improved to some degree. I would like her to work aggressively on lumbar spine core conditioning, if symptoms worsen or plateau we can consider bilateral L4-L5 transforaminal epidurals.    ___________________________________________ Ihor Austin. Benjamin Stain, M.D., ABFM., CAQSM. Primary Care and Sports Medicine [facility]  Adjunct Instructor of Family Medicine  [employer]

## 2021-03-27 NOTE — Assessment & Plan Note (Signed)
This pleasant 45 year old female has a history of multiple sclerosis, recent imaging of the brain and cervical spine only showed brain T2 hyperintensities, no cervical cord lesions, we did an MRI of her thoracic and lumbar spine, symptomatology was predominately numbness, tingling, weakness in both legs anteriorly above the knees. She will follow-up her thoracolumbar spine MRI findings with her neurologist, lumbar spine MRI did show a broad-based L4-L5 disc protrusion likely affecting both L4 nerve roots, this can explain her symptomatology which has thankfully improved to some degree. I would like her to work aggressively on lumbar spine core conditioning, if symptoms worsen or plateau we can consider bilateral L4-L5 transforaminal epidurals.

## 2021-03-27 NOTE — Assessment & Plan Note (Signed)
Essentially resolved after cryotherapy at the last visit, return as needed for this.

## 2021-04-08 DIAGNOSIS — M25512 Pain in left shoulder: Secondary | ICD-10-CM | POA: Diagnosis not present

## 2021-04-22 DIAGNOSIS — M25512 Pain in left shoulder: Secondary | ICD-10-CM | POA: Diagnosis not present

## 2021-05-20 DIAGNOSIS — M25512 Pain in left shoulder: Secondary | ICD-10-CM | POA: Diagnosis not present

## 2021-06-17 DIAGNOSIS — M25512 Pain in left shoulder: Secondary | ICD-10-CM | POA: Diagnosis not present

## 2021-07-01 DIAGNOSIS — M25512 Pain in left shoulder: Secondary | ICD-10-CM | POA: Diagnosis not present

## 2021-07-27 DIAGNOSIS — G35 Multiple sclerosis: Secondary | ICD-10-CM | POA: Diagnosis not present

## 2021-07-27 DIAGNOSIS — R29898 Other symptoms and signs involving the musculoskeletal system: Secondary | ICD-10-CM | POA: Diagnosis not present

## 2021-07-29 DIAGNOSIS — M25512 Pain in left shoulder: Secondary | ICD-10-CM | POA: Diagnosis not present

## 2021-08-12 DIAGNOSIS — M25512 Pain in left shoulder: Secondary | ICD-10-CM | POA: Diagnosis not present

## 2021-08-26 DIAGNOSIS — M25512 Pain in left shoulder: Secondary | ICD-10-CM | POA: Diagnosis not present

## 2021-09-09 DIAGNOSIS — M25512 Pain in left shoulder: Secondary | ICD-10-CM | POA: Diagnosis not present

## 2021-09-23 DIAGNOSIS — R531 Weakness: Secondary | ICD-10-CM | POA: Diagnosis not present

## 2021-09-23 DIAGNOSIS — R29898 Other symptoms and signs involving the musculoskeletal system: Secondary | ICD-10-CM | POA: Diagnosis not present

## 2021-09-25 DIAGNOSIS — M25512 Pain in left shoulder: Secondary | ICD-10-CM | POA: Diagnosis not present

## 2021-10-07 DIAGNOSIS — M25512 Pain in left shoulder: Secondary | ICD-10-CM | POA: Diagnosis not present

## 2021-10-22 DIAGNOSIS — M25512 Pain in left shoulder: Secondary | ICD-10-CM | POA: Diagnosis not present

## 2021-11-04 ENCOUNTER — Ambulatory Visit: Payer: BC Managed Care – PPO | Admitting: Sports Medicine

## 2021-11-04 ENCOUNTER — Telehealth: Payer: Self-pay

## 2021-11-04 DIAGNOSIS — M5416 Radiculopathy, lumbar region: Secondary | ICD-10-CM

## 2021-11-04 MED ORDER — CYCLOBENZAPRINE HCL 10 MG PO TABS: ORAL_TABLET | ORAL | 0 refills | Status: DC

## 2021-11-04 MED ORDER — PREDNISONE 50 MG PO TABS
ORAL_TABLET | ORAL | 0 refills | Status: DC
Start: 2021-11-04 — End: 2022-09-08

## 2021-11-04 NOTE — Telephone Encounter (Signed)
Patient's appt was rescheduled today. She states that she injured her back but thinks it is more muscle and would like a muscle relaxer. She does report taking meloxicam for the pain without help.

## 2021-11-04 NOTE — Telephone Encounter (Signed)
Please give her my apologies for the EMR going down, I am going to call in a burst of prednisone as well as some Flexeril, she should try this as well as some of the exercises we talked about before, if insufficient improvement after a couple of weeks we could certainly try the epidurals we had talked about as well.

## 2021-11-05 DIAGNOSIS — M25512 Pain in left shoulder: Secondary | ICD-10-CM | POA: Diagnosis not present

## 2021-11-06 NOTE — Telephone Encounter (Signed)
Patient aware of recommendations and verbalized understanding

## 2021-11-18 DIAGNOSIS — M25512 Pain in left shoulder: Secondary | ICD-10-CM | POA: Diagnosis not present

## 2021-12-02 DIAGNOSIS — M25512 Pain in left shoulder: Secondary | ICD-10-CM | POA: Diagnosis not present

## 2021-12-22 DIAGNOSIS — M25512 Pain in left shoulder: Secondary | ICD-10-CM | POA: Diagnosis not present

## 2022-01-06 DIAGNOSIS — M25512 Pain in left shoulder: Secondary | ICD-10-CM | POA: Diagnosis not present

## 2022-01-21 DIAGNOSIS — M25512 Pain in left shoulder: Secondary | ICD-10-CM | POA: Diagnosis not present

## 2022-01-21 DIAGNOSIS — Z1231 Encounter for screening mammogram for malignant neoplasm of breast: Secondary | ICD-10-CM | POA: Diagnosis not present

## 2022-02-03 DIAGNOSIS — M25512 Pain in left shoulder: Secondary | ICD-10-CM | POA: Diagnosis not present

## 2022-02-15 DIAGNOSIS — M25512 Pain in left shoulder: Secondary | ICD-10-CM | POA: Diagnosis not present

## 2022-02-15 DIAGNOSIS — Z79899 Other long term (current) drug therapy: Secondary | ICD-10-CM | POA: Diagnosis not present

## 2022-02-15 DIAGNOSIS — G35 Multiple sclerosis: Secondary | ICD-10-CM | POA: Diagnosis not present

## 2022-02-15 DIAGNOSIS — Z880 Allergy status to penicillin: Secondary | ICD-10-CM | POA: Diagnosis not present

## 2022-03-01 DIAGNOSIS — M4802 Spinal stenosis, cervical region: Secondary | ICD-10-CM | POA: Diagnosis not present

## 2022-03-01 DIAGNOSIS — G35 Multiple sclerosis: Secondary | ICD-10-CM | POA: Diagnosis not present

## 2022-03-01 DIAGNOSIS — M50322 Other cervical disc degeneration at C5-C6 level: Secondary | ICD-10-CM | POA: Diagnosis not present

## 2022-03-12 DIAGNOSIS — M542 Cervicalgia: Secondary | ICD-10-CM | POA: Diagnosis not present

## 2022-03-12 DIAGNOSIS — M545 Low back pain, unspecified: Secondary | ICD-10-CM | POA: Diagnosis not present

## 2022-03-12 DIAGNOSIS — M79669 Pain in unspecified lower leg: Secondary | ICD-10-CM | POA: Diagnosis not present

## 2022-03-15 DIAGNOSIS — J0110 Acute frontal sinusitis, unspecified: Secondary | ICD-10-CM | POA: Diagnosis not present

## 2022-03-26 DIAGNOSIS — M545 Low back pain, unspecified: Secondary | ICD-10-CM | POA: Diagnosis not present

## 2022-03-26 DIAGNOSIS — M542 Cervicalgia: Secondary | ICD-10-CM | POA: Diagnosis not present

## 2022-03-26 DIAGNOSIS — M79669 Pain in unspecified lower leg: Secondary | ICD-10-CM | POA: Diagnosis not present

## 2022-08-17 ENCOUNTER — Ambulatory Visit: Payer: No Typology Code available for payment source | Admitting: Physical Medicine and Rehabilitation

## 2022-08-17 ENCOUNTER — Encounter: Payer: Self-pay | Admitting: Physical Medicine and Rehabilitation

## 2022-08-17 DIAGNOSIS — G35 Multiple sclerosis: Secondary | ICD-10-CM | POA: Diagnosis not present

## 2022-08-17 DIAGNOSIS — M47816 Spondylosis without myelopathy or radiculopathy, lumbar region: Secondary | ICD-10-CM

## 2022-08-17 DIAGNOSIS — M545 Low back pain, unspecified: Secondary | ICD-10-CM | POA: Diagnosis not present

## 2022-08-17 DIAGNOSIS — G8929 Other chronic pain: Secondary | ICD-10-CM

## 2022-08-17 NOTE — Progress Notes (Signed)
Julie Ibarra - 47 y.o. female MRN [id number]  Date of birth: April 30, 1975  Office Visit Note: Visit Date: 08/17/2022 PCP: Monica Becton, MD Referred by: Monica Becton,*  Subjective: Chief Complaint  Patient presents with   Lower Back - Pain   HPI: Julie Ibarra is a 47 y.o. female who comes in today as a self referral for evaluation of chronic, worsening and severe bilateral lower back pain. She was recommended by a friend. Pain ongoing for several years, worsened last year while caring for her father. Her pain worsens with movement and activity. She describes pain as sore and aching, currently rates as 6 out of 10. Some relief of pain with home exercise regimen, rest and use of medications. History of formal physical therapy in the past with minimal relief of pain. She currently attends PT with [facility] for more muscle issues, undergoes dry needling treatments frequently. Does swim and perform pilates regularly.  Lumbar MRI imaging from 2022 exhibits multi level lumbar facet degeneration, most notable at L4-L5 where there is mild spinal canal stenosis and moderate lateral recess narrowing on the right. No high grade spinal canal stenosis. No history of lumbar surgery/injections. Patient reports history of bladder issues for many years, reports frequency and urgency, states she is sometimes unable to make to bathroom before urinating on herself. States symptoms have worsened over the last several months. She was evaluated by Dr. Coralyn Pear with [facility] in 2013, diagnosed with neurogenic bladder. Patient does carry diagnosis of multiple sclerosis, currently being managed by Dr. Harlen Labs with [facility]. Patient reports history of left leg numbness several years ago, NCV with EMG of left lower extremity from that time was negative. Patient denies focal weakness, numbness and tingling. No recent trauma or falls.    Oswestry Disability Index  Score 46% 20 to 30 (60%) severe disability: Pain remains the main problem in this group but activities of daily living are affected. These patients require a detailed investigation.  Review of Systems  Musculoskeletal:  Positive for back pain.  Neurological:  Negative for tingling, sensory change, focal weakness and weakness.  All other systems reviewed and are negative.  Otherwise per HPI.  Assessment & Plan: Visit Diagnoses:    ICD-10-CM   1. Chronic bilateral low back pain without sciatica  M54.50 MR LUMBAR SPINE WO CONTRAST   G89.29 Ambulatory referral to Physical Medicine Rehab    2. Spondylosis without myelopathy or radiculopathy, lumbar region  M47.816 MR LUMBAR SPINE WO CONTRAST    Ambulatory referral to Physical Medicine Rehab    3. Facet arthropathy, lumbar  M47.816 MR LUMBAR SPINE WO CONTRAST    Ambulatory referral to Physical Medicine Rehab    4. Multiple sclerosis (HCC)  G35        Plan: Findings:  Chronic, worsening and severe bilateral lower back pain. No radicular symptoms. Patient continues to have severe pain despite good conservative therapies such as physical therapy, home exercise regimen, rest and use of medications. I discussed prior lumbar MRI with patient today using imaging and spine model. Patients clinical presentation and exam are consistent with facet mediated pain. There is mild to moderate facet arthropathy at L4-L5. Pain noted with lumbar extension today. I discussed treatment plan with patient in detail today. Next step is to perform diagnostic bilateral L4-L5 medial branch blocks under fluoroscopic guidance. If good relief of pain with medial branch blocks we discussed possibility of longer sustained pain relief with radiofrequency ablation. I  provided patient with educational material regarding ablation procedure to take home and review.   Patient is requesting new lumbar MRI imaging at this time as she is concerned chronic bladder issues could be  related to her spine. Dr. Alvester Morin and myself reviewed lumbar MRI imaging from 2022, there are no findings that would correlate with her chronic bladder issues, however we are happy to order new imaging. I will go ahead and place order for both medial branch blocks and lumbar MRI imaging. We will talk more following imaging results. I encouraged patient to continue with PT. No red flag symptoms noted upon exam today.     Meds & Orders: No orders of the defined types were placed in this encounter.   Orders Placed This Encounter  Procedures   MR LUMBAR SPINE WO CONTRAST   Ambulatory referral to Physical Medicine Rehab    Follow-up: Return for Bilateral L4-L5 medial branch blocks.   Procedures: No procedures performed      Clinical History: CLINICAL DATA:  Lumbar radiculitis. Bilateral lower extremity weakness. History of multiple sclerosis.   EXAM: MRI LUMBAR SPINE WITHOUT AND WITH CONTRAST   TECHNIQUE: Multiplanar and multiecho pulse sequences of the lumbar spine were obtained without and with intravenous contrast.   CONTRAST:  9mL GADAVIST GADOBUTROL 1 MMOL/ML IV SOLN   COMPARISON:  Lumbar spine radiographs 02/24/2021   FINDINGS: Segmentation:  Standard.   Alignment:  Normal.   Vertebrae: No fracture, suspicious marrow lesion, or significant marrow edema.   Conus medullaris and cauda equina: Conus extends to the L1 level. Conus and cauda equina appear normal. Distal thoracic spinal cord signal abnormality is more fully evaluated on the separate thoracic spine MRI.   Paraspinal and other soft tissues: Unremarkable.   Disc levels:   Disc desiccation and at most mild disc space narrowing at L1-2, L2-3, and L4-5.   L1-2: Minimal disc bulging and a right foraminal to extraforaminal disc protrusion result in borderline right neural foraminal stenosis without spinal stenosis. The disc protrusion may affect the extraforaminal right L1 nerve.   L2-3: Circumferential disc  bulging eccentric to the right and mild facet hypertrophy result in mild right neural foraminal stenosis with the disc potentially affecting the extraforaminal right L2 nerve. No significant spinal stenosis.   L3-4: Disc bulging and mild facet and ligamentum flavum hypertrophy result in borderline to mild bilateral lateral recess stenosis and borderline bilateral neural foraminal stenosis without significant spinal stenosis.   L4-5: Circumferential disc bulging and mild-to-moderate facet and ligamentum flavum hypertrophy result in mild spinal stenosis, mild bilateral lateral recess stenosis, and mild-to-moderate bilateral neural foraminal stenosis.   L5-S1: Mild facet hypertrophy without disc herniation or stenosis.   IMPRESSION: Multilevel lumbar disc and facet degeneration, most notable at L4-5 where there is mild spinal stenosis and mild-to-moderate bilateral neural foraminal stenosis.     Electronically Signed   By: Sebastian Ache M.D.   On: 03/10/2021 15:16   She reports that she has quit smoking. She has never used smokeless tobacco. No results for input(s): "HGBA1C", "LABURIC" in the last 8760 hours.  Objective:  VS:  HT:    WT:   BMI:     BP:   HR: bpm  TEMP: ( )  RESP:  Physical Exam Vitals and nursing note reviewed.  HENT:     Head: Normocephalic and atraumatic.     Right Ear: External ear normal.     Left Ear: External ear normal.     Nose: Nose normal.  Mouth/Throat:     Mouth: Mucous membranes are moist.  Eyes:     Extraocular Movements: Extraocular movements intact.  Cardiovascular:     Rate and Rhythm: Normal rate.     Pulses: Normal pulses.  Pulmonary:     Effort: Pulmonary effort is normal.  Abdominal:     General: Abdomen is flat. There is no distension.  Musculoskeletal:        General: Tenderness present.     Cervical back: Normal range of motion.     Comments: Patient rises from seated position to standing without difficulty. Concordant  low back pain with facet loading, lumbar spine extension and rotation. 5/5 strength noted with bilateral hip flexion, knee flexion/extension, ankle dorsiflexion/plantarflexion and EHL. No clonus noted bilaterally. No pain upon palpation of greater trochanters. No pain with internal/external rotation of bilateral hips. Sensation intact bilaterally. Negative slump test bilaterally. Ambulates without aid, gait steady.    Skin:    General: Skin is warm and dry.     Capillary Refill: Capillary refill takes less than 2 seconds.  Neurological:     General: No focal deficit present.     Mental Status: She is alert and oriented to person, place, and time.  Psychiatric:        Mood and Affect: Mood normal.        Behavior: Behavior normal.     Ortho Exam  Imaging: No results found.  Past Medical/Family/Surgical/Social History: Medications & Allergies reviewed per EMR, new medications updated. Patient Active Problem List   Diagnosis Date Noted   Skin lesion of chest wall 02/24/2021   Left Achilles tendinosis 11/24/2017   Acute cystitis 12/31/2015   Acute upper respiratory infection 09/23/2015   Seborrheic keratosis 03/25/2014   Joint mouse 03/25/2014   Overweight with complications 03/19/2013   Left cervical radiculopathy 10/09/2012   Trochanteric bursitis of both hips 10/09/2012   Multiple sclerosis (HCC) 10/09/2012   Mixed incontinence 01/20/2012   Lumbar radiculitis 01/20/2012   Past Medical History:  Diagnosis Date   Multiple sclerosis (HCC)    Family History  Problem Relation Age of Onset   Alcohol abuse Mother    Hypertension Mother    COPD Mother    Alcohol abuse Father    Heart disease Father    Hyperlipidemia Father    Hypertension Father    Past Surgical History:  Procedure Laterality Date   ABDOMINAL HYSTERECTOMY     CHOLECYSTECTOMY     Social History   Occupational History   Not on file  Tobacco Use   Smoking status: Former   Smokeless tobacco: Never   Substance and Sexual Activity   Alcohol use: Yes   Drug use: No   Sexual activity: Yes    Birth control/protection: None

## 2022-08-21 ENCOUNTER — Ambulatory Visit: Payer: No Typology Code available for payment source

## 2022-08-21 DIAGNOSIS — M47816 Spondylosis without myelopathy or radiculopathy, lumbar region: Secondary | ICD-10-CM

## 2022-08-21 DIAGNOSIS — G8929 Other chronic pain: Secondary | ICD-10-CM | POA: Diagnosis not present

## 2022-08-21 DIAGNOSIS — M545 Low back pain, unspecified: Secondary | ICD-10-CM | POA: Diagnosis not present

## 2022-08-31 ENCOUNTER — Telehealth: Payer: Self-pay

## 2022-08-31 NOTE — Telephone Encounter (Signed)
LVM to return call to schedule OV 

## 2022-08-31 NOTE — Telephone Encounter (Signed)
-----   Message from Juanda Chance, NP sent at 08/30/2022 11:33 AM EDT ----- Can we have her come in for lumbar MRI review. Thanks ----- Message ----- From: Interface, Rad Results In Sent: 08/30/2022  11:19 AM EDT To: Juanda Chance, NP

## 2022-09-08 ENCOUNTER — Other Ambulatory Visit: Payer: Self-pay

## 2022-09-08 ENCOUNTER — Ambulatory Visit: Payer: No Typology Code available for payment source | Admitting: Physical Medicine and Rehabilitation

## 2022-09-08 VITALS — BP 123/77 | HR 76

## 2022-09-08 DIAGNOSIS — M47816 Spondylosis without myelopathy or radiculopathy, lumbar region: Secondary | ICD-10-CM

## 2022-09-08 MED ORDER — BUPIVACAINE HCL 0.5 % IJ SOLN
3.0000 mL | Freq: Once | INTRAMUSCULAR | Status: AC
Start: 2022-09-08 — End: 2022-09-08
  Administered 2022-09-08: 3 mL

## 2022-09-08 NOTE — Progress Notes (Signed)
Functional Pain Scale - descriptive words and definitions  Unmanageable (7)  Pain interferes with normal ADL's/nothing seems to help/sleep is very difficult/active distractions are very difficult to concentrate on. Severe range order  Average Pain 6   +Driver, -BT, -Dye Allergies.  Lower back pain on both sides with no radiation in legs

## 2022-09-08 NOTE — Patient Instructions (Signed)

## 2022-09-09 ENCOUNTER — Other Ambulatory Visit: Payer: Self-pay | Admitting: Physical Medicine and Rehabilitation

## 2022-09-09 DIAGNOSIS — M545 Low back pain, unspecified: Secondary | ICD-10-CM

## 2022-09-09 DIAGNOSIS — M47816 Spondylosis without myelopathy or radiculopathy, lumbar region: Secondary | ICD-10-CM

## 2022-09-09 NOTE — Progress Notes (Signed)
Per pain diary, 90% relief of pain with first set of bilateral L4-L5 medial branch blocks. We will go ahead and place order for second set. Will call patient to schedule.

## 2022-09-20 NOTE — Procedures (Signed)
Lumbar Diagnostic Facet Joint Nerve Block with Fluoroscopic Guidance   [name]      Date of Birth: [birth year] MRN: [id number] PCP: Monica Becton, MD      Visit Date: 09/08/2022   Universal Protocol:    Date/Time: 06/24/248:00 PM  Consent Given By: the patient  Position: PRONE  Additional Comments: Vital signs were monitored before and after the procedure. Patient was prepped and draped in the usual sterile fashion. The correct patient, procedure, and site was verified.   Injection Procedure Details:   Procedure diagnoses:  1. Spondylosis without myelopathy or radiculopathy, lumbar region      Meds Administered:  Meds ordered this encounter  Medications   bupivacaine (MARCAINE) 0.5 % (with pres) injection 3 mL     Laterality: Bilateral  Location/Site: L4-L5, L3 and L4 medial branches  Needle: 5.0 in., 25 ga.  Short bevel or Quincke spinal needle  Needle Placement: Oblique pedical  Findings:   -Comments: There was excellent flow of contrast along the articular pillars without intravascular flow.  Procedure Details: The fluoroscope beam is vertically oriented in AP and then obliqued 15 to 20 degrees to the ipsilateral side of the desired nerve to achieve the "Scotty dog" appearance.  The skin over the target area of the junction of the superior articulating process and the transverse process (sacral ala if blocking the L5 dorsal rami) was locally anesthetized with a 1 ml volume of 1% Lidocaine without Epinephrine.  The spinal needle was inserted and advanced in a trajectory view down to the target.   After contact with periosteum and negative aspirate for blood and CSF, correct placement without intravascular or epidural spread was confirmed by injecting 0.5 ml. of Isovue-250.  A spot radiograph was obtained of this image.    Next, a 0.5 ml. volume of the injectate described above was injected. The needle was then redirected to the other facet joint  nerves mentioned above if needed.  Prior to the procedure, the patient was given a Pain Diary which was completed for baseline measurements.  After the procedure, the patient rated their pain every 30 minutes and will continue rating at this frequency for a total of 5 hours.  The patient has been asked to complete the Diary and return to [place] by mail, fax or hand delivered as soon as possible.   Additional Comments:  No complications occurred Dressing: 2 x 2 sterile gauze and Band-Aid    Post-procedure details: Patient was observed during the procedure. Post-procedure instructions were reviewed.  Patient left the clinic in stable condition.

## 2022-09-20 NOTE — Progress Notes (Signed)
Julie Ibarra - 47 y.o. female MRN [id number]  Date of birth: 06/14/1975  Office Visit Note: Visit Date: 09/08/2022 PCP: Julie Becton, MD Referred by: Julie Ibarra,*  Subjective: Chief Complaint  Patient presents with   Lower Back - Pain   HPI:  Julie Ibarra is a 47 y.o. female who comes in today at the request of Julie Goodie, FNP for planned Bilateral  L4-5 Lumbar facet/medial branch block with fluoroscopic guidance.  The patient has failed conservative care including home exercise, medications, time and activity modification.  This injection will be diagnostic and hopefully therapeutic.  Please see requesting physician notes for further details and justification.  Exam has shown concordant pain with facet joint loading.   ROS Otherwise per HPI.  Assessment & Plan: Visit Diagnoses:    ICD-10-CM   1. Spondylosis without myelopathy or radiculopathy, lumbar region  M47.816 XR C-ARM NO REPORT    Facet Injection    bupivacaine (MARCAINE) 0.5 % (with pres) injection 3 mL      Plan: No additional findings.   Meds & Orders:  Meds ordered this encounter  Medications   bupivacaine (MARCAINE) 0.5 % (with pres) injection 3 mL    Orders Placed This Encounter  Procedures   Facet Injection   XR C-ARM NO REPORT    Follow-up: Return for Review Pain Diary.   Procedures: No procedures performed  Lumbar Diagnostic Facet Joint Nerve Block with Fluoroscopic Guidance   [name]      Date of Birth: [birth year] MRN: [id number] PCP: Julie Becton, MD      Visit Date: 09/08/2022   Universal Protocol:    Date/Time: 06/24/248:00 PM  Consent Given By: the patient  Position: PRONE  Additional Comments: Vital signs were monitored before and after the procedure. Patient was prepped and draped in the usual sterile fashion. The correct patient, procedure, and site was verified.   Injection Procedure Details:   Procedure diagnoses:  1.  Spondylosis without myelopathy or radiculopathy, lumbar region      Meds Administered:  Meds ordered this encounter  Medications   bupivacaine (MARCAINE) 0.5 % (with pres) injection 3 mL     Laterality: Bilateral  Location/Site: L4-L5, L3 and L4 medial branches  Needle: 5.0 in., 25 ga.  Short bevel or Quincke spinal needle  Needle Placement: Oblique pedical  Findings:   -Comments: There was excellent flow of contrast along the articular pillars without intravascular flow.  Procedure Details: The fluoroscope beam is vertically oriented in AP and then obliqued 15 to 20 degrees to the ipsilateral side of the desired nerve to achieve the "Scotty dog" appearance.  The skin over the target area of the junction of the superior articulating process and the transverse process (sacral ala if blocking the L5 dorsal rami) was locally anesthetized with a 1 ml volume of 1% Lidocaine without Epinephrine.  The spinal needle was inserted and advanced in a trajectory view down to the target.   After contact with periosteum and negative aspirate for blood and CSF, correct placement without intravascular or epidural spread was confirmed by injecting 0.5 ml. of Isovue-250.  A spot radiograph was obtained of this image.    Next, a 0.5 ml. volume of the injectate described above was injected. The needle was then redirected to the other facet joint nerves mentioned above if needed.  Prior to the procedure, the patient was given a Pain Diary which was completed for baseline measurements.  After the procedure, the patient  rated their pain every 30 minutes and will continue rating at this frequency for a total of 5 hours.  The patient has been asked to complete the Diary and return to [place] by mail, fax or hand delivered as soon as possible.   Additional Comments:  No complications occurred Dressing: 2 x 2 sterile gauze and Band-Aid    Post-procedure details: Patient was observed during the  procedure. Post-procedure instructions were reviewed.  Patient left the clinic in stable condition.    Clinical History: CLINICAL DATA:  Low back pain, symptoms persist with greater than 6 weeks treatment.   EXAM: MRI LUMBAR SPINE WITHOUT CONTRAST   TECHNIQUE: Multiplanar, multisequence MR imaging of the lumbar spine was performed. No intravenous contrast was administered.   COMPARISON:  MRI lumbar spine 03/09/2021.   FINDINGS: Segmentation: Conventional numbering is assumed with 5 non-rib-bearing, lumbar type vertebral bodies.   Alignment:  Physiologic.   Vertebrae: Normal vertebral body heights. No suspicious marrow lesions.   Conus medullaris and cauda equina: Conus extends to the L1 level. Conus and cauda equina appear normal.   Paraspinal and other soft tissues: Mild fatty atrophy of the paraspinal muscles.   Disc levels:   T12-L1:  Normal.   L1-L2: Small disc bulge without spinal canal stenosis or neural foraminal narrowing.   L2-L3: Small disc bulge without spinal canal stenosis or neural foraminal narrowing.   L3-L4: Small disc bulge and mild bilateral facet arthropathy. No spinal canal stenosis or neural foraminal narrowing.   L4-L5: Disc bulge and worsening bilateral facet arthropathy with new periarticular edema results in moderate spinal canal stenosis, worse from prior. Mild bilateral neural foraminal narrowing.   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 No disc herniation, spinal canal stenosis or neural foraminal narrowing.   IMPRESSION: Progressive facet arthropathy at L4-L5 with new periarticular edema, which can be a cause of back pain. In addition, there is now moderate spinal canal stenosis at this level.     Electronically Signed   By: Julie Ibarra M.D.   On: 08/30/2022 11:16     Objective:  VS:  HT:    WT:   BMI:     BP:123/77  HR:76bpm  TEMP: ( )  RESP:  Physical Exam Vitals and nursing note reviewed.   Constitutional:      General: She is not in acute distress.    Appearance: Normal appearance. She is not ill-appearing.  HENT:     Head: Normocephalic and atraumatic.     Right Ear: External ear normal.     Left Ear: External ear normal.  Eyes:     Extraocular Movements: Extraocular movements intact.  Cardiovascular:     Rate and Rhythm: Normal rate.     Pulses: Normal pulses.  Pulmonary:     Effort: Pulmonary effort is normal. No respiratory distress.  Abdominal:     General: There is no distension.     Palpations: Abdomen is soft.  Musculoskeletal:        General: Tenderness present.     Cervical back: Neck supple.     Right lower leg: No edema.     Left lower leg: No edema.     Comments: Patient has good distal strength with no pain over the greater trochanters.  No clonus or focal weakness.  Skin:    Findings: No erythema, lesion or rash.  Neurological:     General: No focal deficit present.     Mental Status: She is alert and  oriented to person, place, and time.     Sensory: No sensory deficit.     Motor: No weakness or abnormal muscle tone.     Coordination: Coordination normal.  Psychiatric:        Mood and Affect: Mood normal.        Behavior: Behavior normal.      Imaging: No results found.

## 2022-09-28 ENCOUNTER — Other Ambulatory Visit: Payer: No Typology Code available for payment source

## 2022-10-11 ENCOUNTER — Ambulatory Visit: Payer: No Typology Code available for payment source | Admitting: Physical Medicine and Rehabilitation

## 2022-10-11 ENCOUNTER — Other Ambulatory Visit: Payer: Self-pay

## 2022-10-11 DIAGNOSIS — M47816 Spondylosis without myelopathy or radiculopathy, lumbar region: Secondary | ICD-10-CM | POA: Diagnosis not present

## 2022-10-11 MED ORDER — BUPIVACAINE HCL 0.5 % IJ SOLN
3.0000 mL | Freq: Once | INTRAMUSCULAR | Status: AC
Start: 2022-10-11 — End: ?

## 2022-10-11 NOTE — Progress Notes (Unsigned)
Bilateral L4-L5 medial branch block #2-  Functional Pain Scale - descriptive words and definitions  Moderate (4)   Constantly aware of pain, can complete ADLs with modification/sleep marginally affected at times/passive distraction is of no use, but active distraction gives some relief. Moderate range order  Average Pain 4   +Driver, -BT, -Dye Allergies.

## 2022-10-12 ENCOUNTER — Other Ambulatory Visit: Payer: Self-pay | Admitting: Physical Medicine and Rehabilitation

## 2022-10-12 ENCOUNTER — Encounter: Payer: Self-pay | Admitting: Physical Medicine and Rehabilitation

## 2022-10-12 DIAGNOSIS — M545 Low back pain, unspecified: Secondary | ICD-10-CM

## 2022-10-12 DIAGNOSIS — M47816 Spondylosis without myelopathy or radiculopathy, lumbar region: Secondary | ICD-10-CM

## 2022-10-13 NOTE — Procedures (Signed)
Lumbar Diagnostic Facet Joint Nerve Block with Fluoroscopic Guidance   [name]      Date of Birth: 09/21/1975 MRN: [id number] PCP: Monica Becton, MD      Visit Date: 10/11/2022   Universal Protocol:    Date/Time: 07/17/246:21 AM  Consent Given By: the patient  Position: PRONE  Additional Comments: Vital signs were monitored before and after the procedure. Patient was prepped and draped in the usual sterile fashion. The correct patient, procedure, and site was verified.   Injection Procedure Details:   Procedure diagnoses:  1. Spondylosis without myelopathy or radiculopathy, lumbar region      Meds Administered:  Meds ordered this encounter  Medications   bupivacaine (MARCAINE) 0.5 % (with pres) injection 3 mL     Laterality: Bilateral  Location/Site: L4-L5, L3 and L4 medial branches  Needle: 5.0 in., 25 ga.  Short bevel or Quincke spinal needle  Needle Placement: Oblique pedical  Findings:   -Comments: There was excellent flow of contrast along the articular pillars without intravascular flow.  Procedure Details: The fluoroscope beam is vertically oriented in AP and then obliqued 15 to 20 degrees to the ipsilateral side of the desired nerve to achieve the "Scotty dog" appearance.  The skin over the target area of the junction of the superior articulating process and the transverse process (sacral ala if blocking the L5 dorsal rami) was locally anesthetized with a 1 ml volume of 1% Lidocaine without Epinephrine.  The spinal needle was inserted and advanced in a trajectory view down to the target.   After contact with periosteum and negative aspirate for blood and CSF, correct placement without intravascular or epidural spread was confirmed by injecting 0.5 ml. of Isovue-250.  A spot radiograph was obtained of this image.    Next, a 0.5 ml. volume of the injectate described above was injected. The needle was then redirected to the other facet joint  nerves mentioned above if needed.  Prior to the procedure, the patient was given a Pain Diary which was completed for baseline measurements.  After the procedure, the patient rated their pain every 30 minutes and will continue rating at this frequency for a total of 5 hours.  The patient has been asked to complete the Diary and return to [place] by mail, fax or hand delivered as soon as possible.   Additional Comments:  No complications occurred Dressing: 2 x 2 sterile gauze and Band-Aid    Post-procedure details: Patient was observed during the procedure. Post-procedure instructions were reviewed.  Patient left the clinic in stable condition.

## 2022-10-13 NOTE — Progress Notes (Signed)
Julie Ibarra - 47 y.o. female MRN [id number]  Date of birth: 1976-03-07  Office Visit Note: Visit Date: 10/11/2022 PCP: Monica Becton, MD Referred by: Monica Becton,*  Subjective: Chief Complaint  Patient presents with   Lower Back - Pain   HPI:  Julie Ibarra is a 47 y.o. female who comes in today for planned repeat Bilateral L4-5 Lumbar facet/medial branch block with fluoroscopic guidance.  The patient has failed conservative care including home exercise, medications, time and activity modification.  This injection will be diagnostic and hopefully therapeutic.  Please see requesting physician notes for further details and justification.  Exam shows concordant low back pain with facet joint loading and extension. Patient received more than 80% pain relief from prior injection. This would be the second block in a diagnostic double block paradigm.     Referring:Megan Mayford Knife, FNP   ROS Otherwise per HPI.  Assessment & Plan: Visit Diagnoses:    ICD-10-CM   1. Spondylosis without myelopathy or radiculopathy, lumbar region  M47.816 XR C-ARM NO REPORT    Facet Injection    bupivacaine (MARCAINE) 0.5 % (with pres) injection 3 mL      Plan: No additional findings.   Meds & Orders:  Meds ordered this encounter  Medications   bupivacaine (MARCAINE) 0.5 % (with pres) injection 3 mL    Orders Placed This Encounter  Procedures   Facet Injection   XR C-ARM NO REPORT    Follow-up: Return for Review Pain Diary.   Procedures: No procedures performed  Lumbar Diagnostic Facet Joint Nerve Block with Fluoroscopic Guidance   [name]      Date of Birth: 05/22/1975 MRN: [id number] PCP: Monica Becton, MD      Visit Date: 10/11/2022   Universal Protocol:    Date/Time: 07/17/246:21 AM  Consent Given By: the patient  Position: PRONE  Additional Comments: Vital signs were monitored before and after the procedure. Patient was prepped and  draped in the usual sterile fashion. The correct patient, procedure, and site was verified.   Injection Procedure Details:   Procedure diagnoses:  1. Spondylosis without myelopathy or radiculopathy, lumbar region      Meds Administered:  Meds ordered this encounter  Medications   bupivacaine (MARCAINE) 0.5 % (with pres) injection 3 mL     Laterality: Bilateral  Location/Site: L4-L5, L3 and L4 medial branches  Needle: 5.0 in., 25 ga.  Short bevel or Quincke spinal needle  Needle Placement: Oblique pedical  Findings:   -Comments: There was excellent flow of contrast along the articular pillars without intravascular flow.  Procedure Details: The fluoroscope beam is vertically oriented in AP and then obliqued 15 to 20 degrees to the ipsilateral side of the desired nerve to achieve the "Scotty dog" appearance.  The skin over the target area of the junction of the superior articulating process and the transverse process (sacral ala if blocking the L5 dorsal rami) was locally anesthetized with a 1 ml volume of 1% Lidocaine without Epinephrine.  The spinal needle was inserted and advanced in a trajectory view down to the target.   After contact with periosteum and negative aspirate for blood and CSF, correct placement without intravascular or epidural spread was confirmed by injecting 0.5 ml. of Isovue-250.  A spot radiograph was obtained of this image.    Next, a 0.5 ml. volume of the injectate described above was injected. The needle was then redirected to the other facet joint nerves mentioned above if  needed.  Prior to the procedure, the patient was given a Pain Diary which was completed for baseline measurements.  After the procedure, the patient rated their pain every 30 minutes and will continue rating at this frequency for a total of 5 hours.  The patient has been asked to complete the Diary and return to [place] by mail, fax or hand delivered as soon as possible.   Additional  Comments:  No complications occurred Dressing: 2 x 2 sterile gauze and Band-Aid    Post-procedure details: Patient was observed during the procedure. Post-procedure instructions were reviewed.  Patient left the clinic in stable condition.   Clinical History: CLINICAL DATA:  Low back pain, symptoms persist with greater than 6 weeks treatment.   EXAM: MRI LUMBAR SPINE WITHOUT CONTRAST   TECHNIQUE: Multiplanar, multisequence MR imaging of the lumbar spine was performed. No intravenous contrast was administered.   COMPARISON:  MRI lumbar spine 03/09/2021.   FINDINGS: Segmentation: Conventional numbering is assumed with 5 non-rib-bearing, lumbar type vertebral bodies.   Alignment:  Physiologic.   Vertebrae: Normal vertebral body heights. No suspicious marrow lesions.   Conus medullaris and cauda equina: Conus extends to the L1 level. Conus and cauda equina appear normal.   Paraspinal and other soft tissues: Mild fatty atrophy of the paraspinal muscles.   Disc levels:   T12-L1:  Normal.   L1-L2: Small disc bulge without spinal canal stenosis or neural foraminal narrowing.   L2-L3: Small disc bulge without spinal canal stenosis or neural foraminal narrowing.   L3-L4: Small disc bulge and mild bilateral facet arthropathy. No spinal canal stenosis or neural foraminal narrowing.   L4-L5: Disc bulge and worsening bilateral facet arthropathy with new periarticular edema results in moderate spinal canal stenosis, worse from prior. Mild bilateral neural foraminal narrowing.   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 No disc herniation, spinal canal stenosis or neural foraminal narrowing.   IMPRESSION: Progressive facet arthropathy at L4-L5 with new periarticular edema, which can be a cause of back pain. In addition, there is now moderate spinal canal stenosis at this level.     Electronically Signed   By: Orvan Falconer M.D.   On: 08/30/2022 11:16      Objective:  VS:  HT:    WT:   BMI:     BP:   HR: bpm  TEMP: ( )  RESP:  Physical Exam Vitals and nursing note reviewed.  Constitutional:      General: She is not in acute distress.    Appearance: Normal appearance. She is not ill-appearing.  HENT:     Head: Normocephalic and atraumatic.     Right Ear: External ear normal.     Left Ear: External ear normal.  Eyes:     Extraocular Movements: Extraocular movements intact.  Cardiovascular:     Rate and Rhythm: Normal rate.     Pulses: Normal pulses.  Pulmonary:     Effort: Pulmonary effort is normal. No respiratory distress.  Abdominal:     General: There is no distension.     Palpations: Abdomen is soft.  Musculoskeletal:        General: Tenderness present.     Cervical back: Neck supple.     Right lower leg: No edema.     Left lower leg: No edema.     Comments: Patient has good distal strength with no pain over the greater trochanters.  No clonus or focal weakness. Patient somewhat slow to rise from  a seated position to full extension.  There is concordant low back pain with facet loading and lumbar spine extension rotation.  There are no definitive trigger points but the patient is somewhat tender across the lower back and PSIS.  There is no pain with hip rotation.   Skin:    Findings: No erythema, lesion or rash.  Neurological:     General: No focal deficit present.     Mental Status: She is alert and oriented to person, place, and time.     Sensory: No sensory deficit.     Motor: No weakness or abnormal muscle tone.     Coordination: Coordination normal.  Psychiatric:        Mood and Affect: Mood normal.        Behavior: Behavior normal.      Imaging: No results found.

## 2022-10-29 ENCOUNTER — Ambulatory Visit: Payer: No Typology Code available for payment source | Admitting: Sports Medicine

## 2022-10-29 DIAGNOSIS — G4719 Other hypersomnia: Secondary | ICD-10-CM

## 2022-10-29 DIAGNOSIS — M47816 Spondylosis without myelopathy or radiculopathy, lumbar region: Secondary | ICD-10-CM

## 2022-10-29 DIAGNOSIS — Z23 Encounter for immunization: Secondary | ICD-10-CM

## 2022-10-29 DIAGNOSIS — Z0000 Encounter for general adult medical examination without abnormal findings: Secondary | ICD-10-CM

## 2022-10-29 DIAGNOSIS — Z1211 Encounter for screening for malignant neoplasm of colon: Secondary | ICD-10-CM

## 2022-10-29 DIAGNOSIS — F39 Unspecified mood [affective] disorder: Secondary | ICD-10-CM | POA: Diagnosis not present

## 2022-10-29 MED ORDER — BUPROPION HCL ER (XL) 150 MG PO TB24
150.0000 mg | ORAL_TABLET | ORAL | 3 refills | Status: DC
Start: 2022-10-29 — End: 2023-02-17

## 2022-10-29 NOTE — Assessment & Plan Note (Signed)
This pleasant 47 year old female returns, she has multiple spondylitic processes, the dominant findings are lumbar spinal stenosis, moderate to severe L4-L5 and facet arthritis moderate to severe L4-L5 bilateral with periarticular edema and a facet cyst on the left. She did get good relief from medial branch blocks with Dr. Alvester Morin, she has ablation scheduled. I explained the anatomy, pathophysiology and treatment approach. She did have some significant denervation atrophy of her paralumbar musculature at the L4-L5 level, so I would like her to focus on extension based home PT.

## 2022-10-29 NOTE — Assessment & Plan Note (Signed)
Normal labs including CBC, CMP, TSH, B12. Mood is okay but could use some work, adding Wellbutrin. I like to follow-up her mood again in 6 weeks with a repeat PHQ/GAD, we will also add home sleep study.

## 2022-10-29 NOTE — Assessment & Plan Note (Signed)
Current on Lexapro, adding Wellbutrin, 6-week follow-up for repeat PHQ-9 and dose titration.

## 2022-10-29 NOTE — Addendum Note (Signed)
Addended by: Carren Rang A on: 10/29/2022 11:47 AM   Modules accepted: Orders

## 2022-10-29 NOTE — Progress Notes (Signed)
    Procedures performed today:    None.  Independent interpretation of notes and tests performed by another provider:   None.  Brief History, Exam, Impression, and Recommendations:    Lumbar spondylosis This pleasant 47 year old female returns, she has multiple spondylitic processes, the dominant findings are lumbar spinal stenosis, moderate to severe L4-L5 and facet arthritis moderate to severe L4-L5 bilateral with periarticular edema and a facet cyst on the left. She did get good relief from medial branch blocks with Dr. Alvester Morin, she has ablation scheduled. I explained the anatomy, pathophysiology and treatment approach. She did have some significant denervation atrophy of her paralumbar musculature at the L4-L5 level, so I would like her to focus on extension based home PT.  Annual physical exam Up-to-date on most screening measures with the exception of colon cancer screening, COVID and Tdap shot. She will get her COVID and Tdap today and I will set her up with Cologuard.  Excessive daytime sleepiness Normal labs including CBC, CMP, TSH, B12. Mood is okay but could use some work, adding Wellbutrin. I like to follow-up her mood again in 6 weeks with a repeat PHQ/GAD, we will also add home sleep study.  Mood disorder (HCC) Current on Lexapro, adding Wellbutrin, 6-week follow-up for repeat PHQ-9 and dose titration.    ____________________________________________ Ihor Austin. Benjamin Stain, M.D., ABFM., CAQSM., AME. Primary Care and Sports Medicine [facility]  Adjunct Professor of Family Medicine  [employer] [employer]  Restaurant manager, fast food

## 2022-10-29 NOTE — Assessment & Plan Note (Signed)
Up-to-date on most screening measures with the exception of colon cancer screening, COVID and Tdap shot. She will get her COVID and Tdap today and I will set her up with Cologuard.

## 2022-10-29 NOTE — Addendum Note (Signed)
Addended by: Carren Rang A on: 10/29/2022 11:51 AM   Modules accepted: Orders

## 2022-11-02 ENCOUNTER — Ambulatory Visit: Payer: No Typology Code available for payment source | Admitting: Physical Medicine and Rehabilitation

## 2022-11-02 ENCOUNTER — Other Ambulatory Visit: Payer: Self-pay

## 2022-11-02 VITALS — BP 120/84 | HR 76

## 2022-11-02 DIAGNOSIS — M47816 Spondylosis without myelopathy or radiculopathy, lumbar region: Secondary | ICD-10-CM

## 2022-11-02 MED ORDER — METHYLPREDNISOLONE ACETATE 80 MG/ML IJ SUSP
80.0000 mg | Freq: Once | INTRAMUSCULAR | Status: AC
  Administered 2022-11-02: 80 mg

## 2022-11-02 NOTE — Patient Instructions (Signed)

## 2022-11-02 NOTE — Progress Notes (Signed)
Functional Pain Scale - descriptive words and definitions  Moderate (4)   Constantly aware of pain, can complete ADLs with modification/sleep marginally affected at times/passive distraction is of no use, but active distraction gives some relief. Moderate range order  Average Pain 3-4   +Driver, -BT, -Dye Allergies.  Lower back pain

## 2022-11-13 NOTE — Procedures (Signed)
Lumbar Facet Joint Nerve Denervation  [name]      Date of Birth: 05-28-1975 MRN: [id number] PCP: Monica Becton, MD      Visit Date: 11/02/2022   Universal Protocol:    Date/Time: 08/17/246:40 PM  Consent Given By: the patient  Position: PRONE  Additional Comments: Vital signs were monitored before and after the procedure. Patient was prepped and draped in the usual sterile fashion. The correct patient, procedure, and site was verified.   Injection Procedure Details:   Procedure diagnoses:  1. Spondylosis without myelopathy or radiculopathy, lumbar region      Meds Administered:  Meds ordered this encounter  Medications   methylPREDNISolone acetate (DEPO-MEDROL) injection 80 mg     Laterality: Bilateral  Location/Site:  L5-S1, L4 medial branch and L5 dorsal ramus  Needle: 18 ga.,  10mm active tip, RF Cannula  Needle Placement: Along juncture of superior articular process and transverse pocess  Findings:  -Comments:  Procedure Details: For each desired target nerve, the corresponding transverse process (sacral ala for the L5 dorsal rami) was identified and the fluoroscope was positioned to square off the endplates of the corresponding vertebral body to achieve a true AP midline view.  The beam was then obliqued 15 to 20 degrees and caudally tilted 15 to 20 degrees to line up a trajectory along the target nerves. The skin over the target of the junction of superior articulating process and transverse process (sacral ala for the L5 dorsal rami) was infiltrated with 1ml of 1% Lidocaine without Epinephrine.  The 18 gauge 10mm active tip outer cannula was advanced in trajectory view to the target.  This procedure was repeated for each target nerve.  Then, for all levels, the outer cannula placement was fine-tuned and the position was then confirmed with bi-planar imaging.    Test stimulation was done both at sensory and motor levels to ensure  there was no radicular stimulation. The target tissues were then infiltrated with 1 ml of 1% Lidocaine without Epinephrine. Subsequently, a percutaneous neurotomy was carried out for 90 seconds at 80 degrees Celsius.  After the completion of the lesion, 1 ml of injectate was delivered. It was then repeated for each facet joint nerve mentioned above. Appropriate radiographs were obtained to verify the probe placement during the neurotomy.   Additional Comments:  No complications occurred Dressing: 2 x 2 sterile gauze and Band-Aid    Post-procedure details: Patient was observed during the procedure. Post-procedure instructions were reviewed.  Patient left the clinic in stable condition.

## 2022-11-13 NOTE — Progress Notes (Signed)
Julie Ibarra - 47 y.o. female MRN [id number]  Date of birth: 05/17/1975  Office Visit Note: Visit Date: 11/02/2022 PCP: Monica Becton, MD Referred by: Monica Becton,*  Subjective: Chief Complaint  Patient presents with   Lower Back - Pain   HPI:  Julie Ibarra is a 47 y.o. female who comes in todayfor planned radiofrequency ablation of the Bilateral L5-S1 Lumbar facet joints. This would be ablation of the corresponding medial branches and/or dorsal rami.  Patient has had double diagnostic blocks with more than 50% relief.  These are documented on pain diary.  They have had chronic back pain for quite some time, more than 3 months, which has been an ongoing situation with recalcitrant axial back pain.  They have no radicular pain.  Their axial pain is worse with standing and ambulating and on exam today with facet loading.  They have had physical therapy as well as home exercise program.  The imaging noted in the chart below indicated facet pathology. Accordingly they meet all the criteria and qualification for for radiofrequency ablation and we are going to complete this today hopefully for more longer term relief as part of comprehensive management program.   ROS Otherwise per HPI.  Assessment & Plan: Visit Diagnoses:    ICD-10-CM   1. Spondylosis without myelopathy or radiculopathy, lumbar region  M47.816 XR C-ARM NO REPORT    Radiofrequency,Lumbar    methylPREDNISolone acetate (DEPO-MEDROL) injection 80 mg      Plan: No additional findings.   Meds & Orders:  Meds ordered this encounter  Medications   methylPREDNISolone acetate (DEPO-MEDROL) injection 80 mg    Orders Placed This Encounter  Procedures   Radiofrequency,Lumbar   XR C-ARM NO REPORT    Follow-up: Return if symptoms worsen or fail to improve.   Procedures: No procedures performed  Lumbar Facet Joint Nerve Denervation  [name]      Date of Birth: 30-May-1975 MRN: [id number] PCP:  Monica Becton, MD      Visit Date: 11/02/2022   Universal Protocol:    Date/Time: 08/17/246:40 PM  Consent Given By: the patient  Position: PRONE  Additional Comments: Vital signs were monitored before and after the procedure. Patient was prepped and draped in the usual sterile fashion. The correct patient, procedure, and site was verified.   Injection Procedure Details:   Procedure diagnoses:  1. Spondylosis without myelopathy or radiculopathy, lumbar region      Meds Administered:  Meds ordered this encounter  Medications   methylPREDNISolone acetate (DEPO-MEDROL) injection 80 mg     Laterality: Bilateral  Location/Site:  L5-S1, L4 medial branch and L5 dorsal ramus  Needle: 18 ga.,  10mm active tip, RF Cannula  Needle Placement: Along juncture of superior articular process and transverse pocess  Findings:  -Comments:  Procedure Details: For each desired target nerve, the corresponding transverse process (sacral ala for the L5 dorsal rami) was identified and the fluoroscope was positioned to square off the endplates of the corresponding vertebral body to achieve a true AP midline view.  The beam was then obliqued 15 to 20 degrees and caudally tilted 15 to 20 degrees to line up a trajectory along the target nerves. The skin over the target of the junction of superior articulating process and transverse process (sacral ala for the L5 dorsal rami) was infiltrated with 1ml of 1% Lidocaine without Epinephrine.  The 18 gauge 10mm active tip outer cannula was advanced in trajectory view to the target.  This procedure was repeated for each target nerve.  Then, for all levels, the outer cannula placement was fine-tuned and the position was then confirmed with bi-planar imaging.    Test stimulation was done both at sensory and motor levels to ensure there was no radicular stimulation. The target tissues were then infiltrated with 1 ml of 1% Lidocaine without  Epinephrine. Subsequently, a percutaneous neurotomy was carried out for 90 seconds at 80 degrees Celsius.  After the completion of the lesion, 1 ml of injectate was delivered. It was then repeated for each facet joint nerve mentioned above. Appropriate radiographs were obtained to verify the probe placement during the neurotomy.   Additional Comments:  No complications occurred Dressing: 2 x 2 sterile gauze and Band-Aid    Post-procedure details: Patient was observed during the procedure. Post-procedure instructions were reviewed.  Patient left the clinic in stable condition.      Clinical History: CLINICAL DATA:  Low back pain, symptoms persist with greater than 6 weeks treatment.   EXAM: MRI LUMBAR SPINE WITHOUT CONTRAST   TECHNIQUE: Multiplanar, multisequence MR imaging of the lumbar spine was performed. No intravenous contrast was administered.   COMPARISON:  MRI lumbar spine 03/09/2021.   FINDINGS: Segmentation: Conventional numbering is assumed with 5 non-rib-bearing, lumbar type vertebral bodies.   Alignment:  Physiologic.   Vertebrae: Normal vertebral body heights. No suspicious marrow lesions.   Conus medullaris and cauda equina: Conus extends to the L1 level. Conus and cauda equina appear normal.   Paraspinal and other soft tissues: Mild fatty atrophy of the paraspinal muscles.   Disc levels:   T12-L1:  Normal.   L1-L2: Small disc bulge without spinal canal stenosis or neural foraminal narrowing.   L2-L3: Small disc bulge without spinal canal stenosis or neural foraminal narrowing.   L3-L4: Small disc bulge and mild bilateral facet arthropathy. No spinal canal stenosis or neural foraminal narrowing.   L4-L5: Disc bulge and worsening bilateral facet arthropathy with new periarticular edema results in moderate spinal canal stenosis, worse from prior. Mild bilateral neural foraminal narrowing.   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  No disc herniation, spinal canal stenosis or neural foraminal narrowing.   IMPRESSION: Progressive facet arthropathy at L4-L5 with new periarticular edema, which can be a cause of back pain. In addition, there is now moderate spinal canal stenosis at this level.     Electronically Signed   By: Orvan Falconer M.D.   On: 08/30/2022 11:16     Objective:  VS:  HT:    WT:   BMI:     BP:120/84  HR:76bpm  TEMP: ( )  RESP:  Physical Exam Vitals and nursing note reviewed.  Constitutional:      General: She is not in acute distress.    Appearance: Normal appearance. She is not ill-appearing.  HENT:     Head: Normocephalic and atraumatic.     Right Ear: External ear normal.     Left Ear: External ear normal.  Eyes:     Extraocular Movements: Extraocular movements intact.  Cardiovascular:     Rate and Rhythm: Normal rate.     Pulses: Normal pulses.  Pulmonary:     Effort: Pulmonary effort is normal. No respiratory distress.  Abdominal:     General: There is no distension.     Palpations: Abdomen is soft.  Musculoskeletal:        General: Tenderness present.     Cervical back: Neck supple.  Right lower leg: No edema.     Left lower leg: No edema.     Comments: Patient has good distal strength with no pain over the greater trochanters.  No clonus or focal weakness.  Skin:    Findings: No erythema, lesion or rash.  Neurological:     General: No focal deficit present.     Mental Status: She is alert and oriented to person, place, and time.     Sensory: No sensory deficit.     Motor: No weakness or abnormal muscle tone.     Coordination: Coordination normal.  Psychiatric:        Mood and Affect: Mood normal.        Behavior: Behavior normal.      Imaging: No results found.

## 2022-11-16 LAB — COLOGUARD: COLOGUARD: NEGATIVE

## 2022-12-10 ENCOUNTER — Encounter: Payer: Self-pay | Admitting: Sports Medicine

## 2022-12-10 ENCOUNTER — Ambulatory Visit (INDEPENDENT_AMBULATORY_CARE_PROVIDER_SITE_OTHER): Payer: No Typology Code available for payment source | Admitting: Sports Medicine

## 2022-12-10 VITALS — BP 115/75 | HR 89 | Ht 68.0 in | Wt 179.0 lb

## 2022-12-10 DIAGNOSIS — Z23 Encounter for immunization: Secondary | ICD-10-CM | POA: Diagnosis not present

## 2022-12-10 DIAGNOSIS — F39 Unspecified mood [affective] disorder: Secondary | ICD-10-CM

## 2022-12-10 DIAGNOSIS — M47816 Spondylosis without myelopathy or radiculopathy, lumbar region: Secondary | ICD-10-CM

## 2022-12-10 NOTE — Assessment & Plan Note (Signed)
This pleasant 47 refill returns, she has multiple lumbar spondylitic processes, dominant findings are lumbar spinal stenosis and moderate to severe L4-L5 facet arthritis bilaterally with periarticular edema. She did have her RFA with Dr. Alvester Morin, she still has some discomfort 2 weeks out so I would like her to discuss potentially a second ablation if she continues to have discomfort for another month or so. We can certainly try epidurals if insufficient improvement.

## 2022-12-10 NOTE — Progress Notes (Signed)
    Procedures performed today:    None.  Independent interpretation of notes and tests performed by another provider:   None.  Brief History, Exam, Impression, and Recommendations:    Lumbar spondylosis This pleasant 47 refill returns, she has multiple lumbar spondylitic processes, dominant findings are lumbar spinal stenosis and moderate to severe L4-L5 facet arthritis bilaterally with periarticular edema. She did have her RFA with Dr. Alvester Morin, she still has some discomfort 2 weeks out so I would like her to discuss potentially a second ablation if she continues to have discomfort for another month or so. We can certainly try epidurals if insufficient improvement.  Mood disorder (HCC) Continue Lexapro, we added Wellbutrin, fantastic improvements in mood and energy.    ____________________________________________ Ihor Austin. Benjamin Stain, M.D., ABFM., CAQSM., AME. Primary Care and Sports Medicine [place] [facility]  Adjunct Professor of Family Medicine  [employer] [employer]  Restaurant manager, fast food

## 2022-12-10 NOTE — Assessment & Plan Note (Signed)
Continue Lexapro, we added Wellbutrin, fantastic improvements in mood and energy.

## 2023-01-03 ENCOUNTER — Encounter (HOSPITAL_BASED_OUTPATIENT_CLINIC_OR_DEPARTMENT_OTHER): Payer: No Typology Code available for payment source | Admitting: Internal Medicine

## 2023-02-14 ENCOUNTER — Telehealth: Payer: No Typology Code available for payment source | Admitting: Physician Assistant

## 2023-02-14 DIAGNOSIS — M5442 Lumbago with sciatica, left side: Secondary | ICD-10-CM | POA: Diagnosis not present

## 2023-02-14 MED ORDER — CYCLOBENZAPRINE HCL 10 MG PO TABS
5.0000 mg | ORAL_TABLET | Freq: Three times a day (TID) | ORAL | 0 refills | Status: DC | PRN
Start: 2023-02-14 — End: 2023-12-22

## 2023-02-14 MED ORDER — NAPROXEN 500 MG PO TABS
500.0000 mg | ORAL_TABLET | Freq: Two times a day (BID) | ORAL | 0 refills | Status: DC
Start: 2023-02-14 — End: 2023-12-22

## 2023-02-14 NOTE — Progress Notes (Signed)

## 2023-02-17 ENCOUNTER — Encounter: Payer: Self-pay | Admitting: Sports Medicine

## 2023-02-17 DIAGNOSIS — F39 Unspecified mood [affective] disorder: Secondary | ICD-10-CM

## 2023-02-17 MED ORDER — BUPROPION HCL ER (XL) 150 MG PO TB24
150.0000 mg | ORAL_TABLET | ORAL | 2 refills | Status: DC
Start: 2023-02-17 — End: 2023-11-16

## 2023-02-21 ENCOUNTER — Ambulatory Visit: Payer: No Typology Code available for payment source | Admitting: Sports Medicine

## 2023-05-17 ENCOUNTER — Ambulatory Visit: Payer: No Typology Code available for payment source | Admitting: Sports Medicine

## 2023-05-17 ENCOUNTER — Encounter: Payer: Self-pay | Admitting: Sports Medicine

## 2023-05-17 VITALS — BP 107/77 | HR 102 | Temp 98.7°F | Ht 69.0 in | Wt 179.0 lb

## 2023-05-17 DIAGNOSIS — R6889 Other general symptoms and signs: Secondary | ICD-10-CM | POA: Diagnosis not present

## 2023-05-17 DIAGNOSIS — U071 COVID-19: Secondary | ICD-10-CM

## 2023-05-17 LAB — POCT INFLUENZA A/B
Influenza A, POC: NEGATIVE
Influenza B, POC: NEGATIVE

## 2023-05-17 LAB — POC COVID19 BINAXNOW: SARS Coronavirus 2 Ag: POSITIVE — AB

## 2023-05-17 MED ORDER — NIRMATRELVIR/RITONAVIR (PAXLOVID)TABLET: ORAL_TABLET | ORAL | 0 refills | Status: DC

## 2023-05-17 NOTE — Progress Notes (Signed)
    Procedures performed today:    None.  Independent interpretation of notes and tests performed by another provider:   None.  Brief History, Exam, Impression, and Recommendations:    COVID This is a pleasant 48 year old COVID vaccinated female, she has had 2 days of fevers, muscle aches, body aches, cough, sore throat. Appears ill in the exam room today, she is speaking full sentences, no other signs of respiratory distress. COVID-positive, flu negative. Adding Paxlovid. DayQuil/NyQuil for symptomatic relief. She does have a cruise coming up.    ____________________________________________ Ihor Austin. Benjamin Stain, M.D., ABFM., CAQSM., AME. Primary Care and Sports Medicine [place] [facility]  Adjunct Professor of Family Medicine  [place] [employer]  Restaurant manager, fast food

## 2023-05-17 NOTE — Assessment & Plan Note (Signed)
This is a pleasant 48 year old COVID vaccinated female, she has had 2 days of fevers, muscle aches, body aches, cough, sore throat. Appears ill in the exam room today, she is speaking full sentences, no other signs of respiratory distress. COVID-positive, flu negative. Adding Paxlovid. DayQuil/NyQuil for symptomatic relief. She does have a cruise coming up.

## 2023-11-14 ENCOUNTER — Other Ambulatory Visit: Payer: Self-pay | Admitting: Sports Medicine

## 2023-11-14 DIAGNOSIS — F39 Unspecified mood [affective] disorder: Secondary | ICD-10-CM

## 2023-11-29 ENCOUNTER — Encounter: Payer: Self-pay | Admitting: Sports Medicine

## 2023-12-13 ENCOUNTER — Encounter: Payer: No Typology Code available for payment source | Admitting: Sports Medicine

## 2023-12-22 ENCOUNTER — Encounter: Payer: Self-pay | Admitting: Urgent Care

## 2023-12-22 ENCOUNTER — Ambulatory Visit (INDEPENDENT_AMBULATORY_CARE_PROVIDER_SITE_OTHER): Admitting: Urgent Care

## 2023-12-22 VITALS — BP 99/63 | HR 80 | Ht 69.0 in | Wt 188.0 lb

## 2023-12-22 DIAGNOSIS — F39 Unspecified mood [affective] disorder: Secondary | ICD-10-CM | POA: Diagnosis not present

## 2023-12-22 DIAGNOSIS — M47816 Spondylosis without myelopathy or radiculopathy, lumbar region: Secondary | ICD-10-CM | POA: Diagnosis not present

## 2023-12-22 DIAGNOSIS — E538 Deficiency of other specified B group vitamins: Secondary | ICD-10-CM

## 2023-12-22 DIAGNOSIS — Z0000 Encounter for general adult medical examination without abnormal findings: Secondary | ICD-10-CM

## 2023-12-22 MED ORDER — DICLOFENAC EPOLAMINE 1.3 % EX PTCH: 1.0000 | MEDICATED_PATCH | Freq: Two times a day (BID) | CUTANEOUS | 2 refills | Status: AC

## 2023-12-22 MED ORDER — BUPROPION HCL ER (XL) 150 MG PO TB24: 150.0000 mg | ORAL_TABLET | ORAL | 3 refills | Status: AC

## 2023-12-22 NOTE — Progress Notes (Signed)
 Complete physical exam  [name]   DOB: 03-14-1976   48 y.o. Female  MRN: [id number]  Subjective:    Chief Complaint  Patient presents with   Annual Exam    Haja Crego is a 48 y.o. female who presents today for a complete physical exam. She reports consuming a general diet. The patient does not participate in regular exercise at present. She generally feels fairly well. She reports sleeping fairly well. She does not have additional problems to discuss today.   Discussed the use of AI scribe software for clinical note transcription with the patient, who gave verbal consent to proceed.  History of Present Illness   Margrett Kalb is a 48 year old female with multiple sclerosis and chronic back pain who presents for an annual physical exam.  She has a history of multiple sclerosis (MS) diagnosed over 20 years ago and is currently on dimethyl fumarate. She experiences fatigue and pain, which limit her physical activity, and reports bowel and bladder issues attributed to her MS. She takes Ditropan XL for bladder control, which she finds effective.  Her chronic back pain is described as 'taking over my world'. She is scheduled for an epidural next week, having had two previous epidurals and two ablations with limited relief. She is planning a trip to [place] and is concerned about managing her pain during the trip.  She reports menopause symptoms, including hot flashes, which have been bothersome. She has been taking Estrofen, which contains black cohosh, but notes that the hot flashes returned in mid-August. She is scheduled to see a menopause specialist soon.  Her current medications include Wellbutrin  for mood, Lexapro 20 mg, Ritalin 5 mg as needed, Provigil 100 mg daily, vitamin D, a probiotic, vitamin B12, and Coenzyme Q10. She also has a prescription for Concerta 18 mg as needed. She has a history of low vitamin B12 levels and will have this rechecked.  She follows a diet that  was previously keto, resulting in significant weight loss, but has since transitioned to a more balanced diet. She is currently at 188 lbs, with a target weight of 170-180 lbs. Her weight has increased recently due to dietary indiscretions during a work trip.  She reports poor sleep quality due to pain and menopause symptoms. 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 She has a history of tattoos, which she acknowledges as a risk factor for hepatitis C, but she declines further screening at this time.       Most recent fall risk assessment:     No data to display           Most recent depression screenings:    12/22/2023    8:28 AM 10/29/2022   11:48 AM  PHQ 2/9 Scores  PHQ - 2 Score 0 1  PHQ- 9 Score  6    Vision:Not within last year  and Dental: No current dental problems and Receives regular dental care  Patient Active Problem List   Diagnosis Date Noted   COVID 05/17/2023   Annual physical exam 10/29/2022   Excessive daytime sleepiness 10/29/2022   Mood disorder 10/29/2022   Skin lesion of chest wall 02/24/2021   Left Achilles tendinosis 11/24/2017   Seborrheic keratosis 03/25/2014   Joint mouse 03/25/2014   Overweight with complications 03/19/2013   Left cervical radiculopathy 10/09/2012   Trochanteric bursitis of both hips 10/09/2012  Multiple sclerosis 10/09/2012   Mixed incontinence 01/20/2012   Lumbar spondylosis 01/20/2012   Past Medical History:  Diagnosis Date   Anxiety 2012   Cataract Removed   Multiple sclerosis    Past Surgical History:  Procedure Laterality Date   ABDOMINAL HYSTERECTOMY     CHOLECYSTECTOMY     EYE SURGERY     Social History   Tobacco Use   Smoking status: Former   Smokeless tobacco: Never  Substance Use Topics   Alcohol use: Yes   Drug use: No      Patient Care Team: Lowella Benton CROME, [place] as PCP - General (Physician Assistant)    Outpatient Medications Prior to Visit  Medication Sig   methylphenidate 18 MG PO CR tablet Take 18 mg by mouth.   aspirin EC 81 MG tablet Take 81 mg by mouth daily.   Coenzyme Q-10 200 MG CAPS Take by mouth.   CRANBERRY EXTRACT PO Take by mouth.   Dimethyl Fumarate 240 MG CPDR Take 240 mg by mouth 2 (two) times daily.   escitalopram (LEXAPRO) 20 MG tablet Take 20 mg by mouth daily.   methylphenidate (RITALIN) 10 MG tablet Take 5 mg by mouth as needed.   modafinil (PROVIGIL) 100 MG tablet Take 100 mg by mouth daily.   oxybutynin (DITROPAN-XL) 5 MG 24 hr tablet Take 5 mg by mouth daily.   Probiotic Product (PROBIOTIC PO) Take by mouth.   VITAMIN D, CHOLECALCIFEROL, PO Take by mouth.   [DISCONTINUED] buPROPion  (WELLBUTRIN  XL) 150 MG 24 hr tablet TAKE 1 TABLET (150 MG TOTAL) BY MOUTH EVERY MORNING.   [DISCONTINUED] cyclobenzaprine  (FLEXERIL ) 10 MG tablet Take 0.5-1 tablets (5-10 mg total) by mouth 3 (three) times daily as needed. (Patient not taking: Reported on 12/22/2023)   [DISCONTINUED] gabapentin (NEURONTIN) 100 MG capsule Take 100 mg by mouth 2 (two) times daily. (Patient not taking: Reported on 12/22/2023)   [DISCONTINUED] naproxen  (NAPROSYN ) 500 MG tablet Take 1 tablet (500 mg total) by mouth 2 (two) times daily with a meal. (Patient not taking: Reported on 12/22/2023)   [DISCONTINUED] nirmatrelvir /ritonavir  (PAXLOVID ) 20 x 150 MG & 10 x 100MG  TABS 1 dose p.o. twice daily for 5 days, may use any available formulation at pharmacy with the same total dosage. (Patient not taking: Reported on 12/22/2023)   Facility-Administered Medications Prior to Visit  Medication Dose Route Frequency Provider   bupivacaine  (MARCAINE ) 0.5 % (with pres) injection 3 mL  3 mL Other Once     ROS Complete 12 point ROS performed with all pertinent positives listed in HPI      Objective:     BP 99/63   Pulse 80   Ht 5' 9 (1.753 m)   Wt 188 lb (85.3 kg)   SpO2 96%   BMI 27.76 kg/m  BP Readings from  Last 3 Encounters:  12/22/23 99/63  05/17/23 107/77  12/10/22 115/75   Wt Readings from Last 3 Encounters:  12/22/23 188 lb (85.3 kg)  05/17/23 179 lb (81.2 kg)  12/10/22 179 lb (81.2 kg)      Physical Exam Vitals and nursing note reviewed.  Constitutional:      General: She is not in acute distress.    Appearance: Normal appearance. She is not ill-appearing, toxic-appearing or diaphoretic.  HENT:     Head: Normocephalic and atraumatic.     Right Ear: Tympanic membrane, ear canal and external ear normal. There is no impacted cerumen.     Left Ear: Tympanic membrane, ear  canal and external ear normal. There is no impacted cerumen.     Nose: Nose normal.     Mouth/Throat:     Mouth: Mucous membranes are moist.     Pharynx: Oropharynx is clear. No oropharyngeal exudate or posterior oropharyngeal erythema.  Eyes:     General: No scleral icterus.       Right eye: No discharge.        Left eye: No discharge.     Extraocular Movements: Extraocular movements intact.     Pupils: Pupils are equal, round, and reactive to light.  Neck:     Thyroid: No thyroid mass, thyromegaly or thyroid tenderness.  Cardiovascular:     Rate and Rhythm: Normal rate and regular rhythm.     Pulses: Normal pulses.     Heart sounds: No murmur heard. Pulmonary:     Effort: Pulmonary effort is normal. No respiratory distress.     Breath sounds: Normal breath sounds. No stridor. No wheezing or rhonchi.  Abdominal:     General: Abdomen is flat. Bowel sounds are normal. There is no distension.     Palpations: Abdomen is soft. There is no mass.     Tenderness: There is no abdominal tenderness. There is no guarding.  Musculoskeletal:     Cervical back: Normal range of motion and neck supple. No rigidity or tenderness.     Right lower leg: No edema.     Left lower leg: No edema.  Lymphadenopathy:     Cervical: No cervical adenopathy.  Skin:    General: Skin is warm and dry.     Coloration: Skin is not  jaundiced.     Findings: No bruising, erythema or rash.  Neurological:     General: No focal deficit present.     Mental Status: She is alert and oriented to person, place, and time.     Sensory: No sensory deficit.     Motor: No weakness.  Psychiatric:        Mood and Affect: Mood normal.        Behavior: Behavior normal.      No results found for any visits on 12/22/23. Last CBC Lab Results  Component Value Date   WBC 4.6 03/19/2013   HGB 13.6 03/19/2013   HCT 38.6 03/19/2013   MCV 91.9 03/19/2013   MCH 32.4 03/19/2013   RDW 13.4 03/19/2013   PLT 252 03/19/2013   Last metabolic panel Lab Results  Component Value Date   GLUCOSE 84 03/19/2013   NA 142 03/19/2013   K 4.8 03/19/2013   CL 104 03/19/2013   CO2 27 03/19/2013   BUN 17 03/19/2013   CREATININE 0.70 03/19/2013   CALCIUM 9.7 03/19/2013   PROT 6.5 03/19/2013   ALBUMIN 4.4 03/19/2013   BILITOT 0.5 03/19/2013   ALKPHOS 66 03/19/2013   AST 20 03/19/2013   ALT 22 03/19/2013   Last lipids Lab Results  Component Value Date   CHOL 135 03/19/2013   HDL 59 03/19/2013   LDLCALC 63 03/19/2013   TRIG 66 03/19/2013   CHOLHDL 2.3 03/19/2013   Last hemoglobin A1c Lab Results  Component Value Date   HGBA1C 5.1 03/19/2013   Last thyroid functions Lab Results  Component Value Date   TSH 1.926 03/19/2013   Last vitamin D No results found for: 25OHVITD2, 25OHVITD3, VD25OH Last vitamin B12 and Folate No results found for: VITAMINB12, FOLATE      Assessment & Plan:    Routine Health Maintenance  and Physical Exam  Immunization History  Administered Date(s) Administered   Influenza Whole 01/19/2012   Influenza, Seasonal, Injecte, Preservative Fre 12/10/2022   Influenza,inj,Quad PF,6+ Mos 12/31/2015, 01/22/2021   PFIZER(Purple Top)SARS-COV-2 Vaccination 06/01/2019, 07/04/2019   Tdap 10/29/2022    Health Maintenance  Topic Date Due   Zoster Vaccines- Shingrix (1 of 2) Never done   COVID-19  Vaccine (3 - Pfizer risk series) 08/01/2019   Pneumococcal Vaccine: 50+ Years (1 of 1 - PCV) Never done   Influenza Vaccine  06/26/2024 (Originally 10/28/2023)   Hepatitis C Screening  12/21/2024 (Originally 07/19/1991)   HIV Screening  12/21/2024 (Originally 07/18/1988)   Mammogram  01/30/2025   DTaP/Tdap/Td (2 - Td or Tdap) 10/28/2032   Colonoscopy  11/08/2032   HPV VACCINES  Aged Out   Meningococcal B Vaccine  Aged Out   Hepatitis B Vaccines 19-59 Average Risk  Discontinued    Discussed health benefits of physical activity, and encouraged her to engage in regular exercise appropriate for her age and condition.  Problem List Items Addressed This Visit     Annual physical exam - Primary   Relevant Orders   CBC with Differential/Platelet   Hemoglobin A1c   TSH   Lipid panel   Comprehensive metabolic panel with GFR   Lumbar spondylosis   Relevant Medications   diclofenac  (FLECTOR ) 1.3 % PTCH   Mood disorder   Relevant Medications   buPROPion  (WELLBUTRIN  XL) 150 MG 24 hr tablet   Other Visit Diagnoses       B12 deficiency       Relevant Orders   B12 and Folate Panel      Return in about 1 year (around 12/21/2024) for Annual Physical.  Assessment and Plan    Adult Wellness Visit Annual wellness visit conducted. Routine labs due as last done in April. Deferred certain vaccines due to upcoming epidural. - Order comprehensive annual lab panel. - Update mammogram record to reflect last done in November 2023 normal. - Document colon screening as negative in August 2024. - Document hepatitis B vaccination as completed historically. - Discuss Prevnar 20, Zoster, and COVID vaccines at a later date if she decides to proceed, currently deferred.   Chronic back pain with lumbar spinal stenosis and disc bulges Chronic back pain due to lumbar spinal stenosis and disc bulges with progression on MRI. Previous interventions provided limited relief. - Prescribe Flector  patch for  inflammation and musculoskeletal pain. - Consider Lidoderm patch for nerve pain. - Schedule epidural next week. - Follow up with surgeon in October.  Multiple sclerosis Multiple sclerosis with significant fatigue and pain. Neurologist advised against pneumonia vaccine. - Continue recommendations per neurology  Major depressive disorder Major depressive disorder managed with Wellbutrin , effective for mood improvement. - Continue Wellbutrin .  Neurogenic bladder Neurogenic bladder managed with Ditropan, effectively reducing urgency symptoms. - Continue Ditropan.  Menopausal symptoms Menopausal symptoms include hot flashes affecting sleep. Estrofen with black cohosh initially effective but symptoms returned. - Follow up with menopause specialist.  Constipation and diarrhea Intermittent constipation and diarrhea, possibly related to MS. Symptoms are tolerable.  Vitamin B12 deficiency Previous vitamin B12 screening showed low levels. - Order vitamin B12 level with comprehensive lab panel.            Benton LITTIE Gave, PA

## 2023-12-23 ENCOUNTER — Ambulatory Visit: Payer: Self-pay | Admitting: Urgent Care

## 2023-12-23 LAB — CBC WITH DIFFERENTIAL/PLATELET
Basophils Absolute: 0 x10E3/uL (ref 0.0–0.2)
Basos: 1 %
EOS (ABSOLUTE): 0.1 x10E3/uL (ref 0.0–0.4)
Eos: 1 %
Hematocrit: 39.5 % (ref 34.0–46.6)
Hemoglobin: 13.1 g/dL (ref 11.1–15.9)
Immature Grans (Abs): 0.1 x10E3/uL (ref 0.0–0.1)
Immature Granulocytes: 1 %
Lymphocytes Absolute: 1.1 x10E3/uL (ref 0.7–3.1)
Lymphs: 19 %
MCH: 31.7 pg (ref 26.6–33.0)
MCHC: 33.2 g/dL (ref 31.5–35.7)
MCV: 96 fL (ref 79–97)
Monocytes Absolute: 0.5 x10E3/uL (ref 0.1–0.9)
Monocytes: 9 %
Neutrophils Absolute: 4.2 x10E3/uL (ref 1.4–7.0)
Neutrophils: 69 %
Platelets: 224 x10E3/uL (ref 150–450)
RBC: 4.13 x10E6/uL (ref 3.77–5.28)
RDW: 12.6 % (ref 11.7–15.4)
WBC: 6 x10E3/uL (ref 3.4–10.8)

## 2023-12-23 LAB — COMPREHENSIVE METABOLIC PANEL WITH GFR
ALT: 14 IU/L (ref 0–32)
AST: 15 IU/L (ref 0–40)
Albumin: 4.4 g/dL (ref 3.9–4.9)
Alkaline Phosphatase: 73 IU/L (ref 41–116)
BUN/Creatinine Ratio: 39 — ABNORMAL HIGH (ref 9–23)
BUN: 21 mg/dL (ref 6–24)
Bilirubin Total: 0.3 mg/dL (ref 0.0–1.2)
CO2: 22 mmol/L (ref 20–29)
Calcium: 9.2 mg/dL (ref 8.7–10.2)
Chloride: 103 mmol/L (ref 96–106)
Creatinine, Ser: 0.54 mg/dL — ABNORMAL LOW (ref 0.57–1.00)
Globulin, Total: 1.8 g/dL (ref 1.5–4.5)
Glucose: 81 mg/dL (ref 70–99)
Potassium: 4.8 mmol/L (ref 3.5–5.2)
Sodium: 139 mmol/L (ref 134–144)
Total Protein: 6.2 g/dL (ref 6.0–8.5)
eGFR: 112 mL/min/1.73 (ref >59)

## 2023-12-23 LAB — LIPID PANEL
Chol/HDL Ratio: 2.5 ratio (ref 0.0–4.4)
Cholesterol, Total: 163 mg/dL (ref 100–199)
HDL: 66 mg/dL (ref >39)
LDL Chol Calc (NIH): 78 mg/dL (ref 0–99)
Triglycerides: 104 mg/dL (ref 0–149)
VLDL Cholesterol Cal: 19 mg/dL (ref 5–40)

## 2023-12-23 LAB — B12 AND FOLATE PANEL
Folate: 6.6 ng/mL (ref >3.0)
Vitamin B-12: 1347 pg/mL — ABNORMAL HIGH (ref 232–1245)

## 2023-12-23 LAB — TSH: TSH: 1.71 u[IU]/mL (ref 0.450–4.500)

## 2023-12-23 LAB — HEMOGLOBIN A1C
Est. average glucose Bld gHb Est-mCnc: 103 mg/dL
Hgb A1c MFr Bld: 5.2 % (ref 4.8–5.6)

## 2024-12-21 ENCOUNTER — Encounter: Admitting: Urgent Care
